# Patient Record
Sex: Female | Born: 1980 | Race: White | Hispanic: No | Marital: Married | Smoking: Never smoker
Health system: Southern US, Community
[De-identification: ages and names within clinical notes are randomized; demographics above are authoritative.]

## PROBLEM LIST (undated history)

## (undated) DIAGNOSIS — D649 Anemia, unspecified: Secondary | ICD-10-CM

## (undated) DIAGNOSIS — O4290 Premature rupture of membranes, unspecified as to length of time between rupture and onset of labor, unspecified weeks of gestation: Secondary | ICD-10-CM

## (undated) DIAGNOSIS — R51 Headache: Secondary | ICD-10-CM

## (undated) HISTORY — DX: Anemia, unspecified: D64.9

## (undated) HISTORY — PX: DILATION AND CURETTAGE OF UTERUS: SHX78

## (undated) HISTORY — DX: Headache: R51

---

## 2005-03-05 ENCOUNTER — Emergency Department (HOSPITAL_COMMUNITY): Admission: EM | Admit: 2005-03-05 | Discharge: 2005-03-05 | Payer: Self-pay | Admitting: Family Medicine

## 2005-11-18 ENCOUNTER — Other Ambulatory Visit: Admission: RE | Admit: 2005-11-18 | Discharge: 2005-11-18 | Payer: Self-pay | Admitting: Family Medicine

## 2005-12-30 HISTORY — PX: WISDOM TOOTH EXTRACTION: SHX21

## 2007-03-30 ENCOUNTER — Other Ambulatory Visit: Admission: RE | Admit: 2007-03-30 | Discharge: 2007-03-30 | Payer: Self-pay | Admitting: Family Medicine

## 2008-03-30 ENCOUNTER — Other Ambulatory Visit: Admission: RE | Admit: 2008-03-30 | Discharge: 2008-03-30 | Payer: Self-pay | Admitting: Family Medicine

## 2009-05-05 ENCOUNTER — Other Ambulatory Visit: Admission: RE | Admit: 2009-05-05 | Discharge: 2009-05-05 | Payer: Self-pay | Admitting: Family Medicine

## 2010-06-25 ENCOUNTER — Other Ambulatory Visit: Admission: RE | Admit: 2010-06-25 | Discharge: 2010-06-25 | Payer: Self-pay | Admitting: Family Medicine

## 2010-12-30 HISTORY — PX: DILATION AND CURETTAGE OF UTERUS: SHX78

## 2011-02-07 ENCOUNTER — Other Ambulatory Visit: Payer: Self-pay | Admitting: Obstetrics and Gynecology

## 2011-02-07 ENCOUNTER — Observation Stay (HOSPITAL_COMMUNITY)
Admission: AD | Admit: 2011-02-07 | Discharge: 2011-02-08 | Disposition: A | Discharge status: Home / Self Care | Payer: BC Managed Care – PPO | Source: Ambulatory Visit | Attending: Obstetrics and Gynecology | Admitting: Obstetrics and Gynecology

## 2011-02-07 ENCOUNTER — Inpatient Hospital Stay (HOSPITAL_COMMUNITY): Payer: BC Managed Care – PPO

## 2011-02-07 DIAGNOSIS — R58 Hemorrhage, not elsewhere classified: Secondary | ICD-10-CM

## 2011-02-07 DIAGNOSIS — O034 Incomplete spontaneous abortion without complication: Principal | ICD-10-CM | POA: Insufficient documentation

## 2011-02-07 DIAGNOSIS — D649 Anemia, unspecified: Secondary | ICD-10-CM | POA: Insufficient documentation

## 2011-02-07 DIAGNOSIS — R000 Tachycardia, unspecified: Secondary | ICD-10-CM | POA: Insufficient documentation

## 2011-02-07 LAB — SAMPLE TO BLOOD BANK

## 2011-02-07 LAB — CBC
HCT: 29 % — ABNORMAL LOW (ref 36.0–46.0)
MCH: 30.3 pg (ref 26.0–34.0)
MCHC: 33.7 g/dL (ref 30.0–36.0)
Platelets: 268 10*3/uL (ref 150–400)
Platelets: 220 10*3/uL (ref 150–400)
Platelets: 184 10*3/uL (ref 150–400)
RBC: 4.33 MIL/uL (ref 3.87–5.11)
RBC: 3.21 MIL/uL — ABNORMAL LOW (ref 3.87–5.11)
RBC: 2.85 MIL/uL — ABNORMAL LOW (ref 3.87–5.11)
RDW: 12.8 % (ref 11.5–15.5)
RDW: 12.8 % (ref 11.5–15.5)
WBC: 24.7 10*3/uL — ABNORMAL HIGH (ref 4.0–10.5)
WBC: 17.7 10*3/uL — ABNORMAL HIGH (ref 4.0–10.5)

## 2011-02-08 LAB — CBC
Hemoglobin: 7.2 g/dL — ABNORMAL LOW (ref 12.0–15.0)
RBC: 2.35 MIL/uL — ABNORMAL LOW (ref 3.87–5.11)

## 2011-02-11 NOTE — Op Note (Signed)
  [name], [name]                ACCOUNT NO.:  [id number]  MEDICAL RECORD NO.:  [id number]           PATIENT TYPE:  I  LOCATION:  9320                          FACILITY:  [facility]  PHYSICIAN:  Osborn Coho, M.D.   DATE OF BIRTH:  1981/10/04  DATE OF PROCEDURE:  02/07/2011 DATE OF DISCHARGE:                              OPERATIVE REPORT   PREOPERATIVE DIAGNOSES: 1. Incomplete abortion. 2. Anemia. 3. Tachycardia.  POSTOPERATIVE DIAGNOSES: 1. Incomplete abortion. 2. Anemia. 3. Tachycardia.  PROCEDURE:  Suction D and C.  ATTENDING SURGEON:  Osborn Coho, MD  ANESTHESIA:  MAC and local.  FLUIDS:  800 mL.  URINE OUTPUT:  Quantity sufficient via straight cath prior to procedure.  ESTIMATED BLOOD LOSS:  Minimal in the OR, but probably at least about 300-500 in the MAU.  SPECIMENS TO PATHOLOGY:  Products of conception.  COMPLICATIONS:  None.  FINDINGS:  Cynthia Henderson is a 30 year old gravida 1, para 0 who presented to the MAU with heavy bleeding and passing tissue.  I was called to evaluate the patient, and there was tissue in the cervical os, which was removed, and the patient was still having some bleeding and cramping. Ultrasound showed on preliminary report to still have retained products in the uterus.  The patient was tachycardic and was being administered IV fluids, and her blood pressure was stable.  Her hemoglobin dropped from about 13-9, and her white blood cell count was 27.  Gentamicin and clindamycin ordered for the OR, and the patient consented for a suction D and C after explaining to her the risks, benefits, and alternatives.  PROCEDURE IN DETAIL:  The patient was taken to the operating room after the risks, benefits, and alternatives were discussed with the patient. The patient verbalized understanding, consent signed and witnessed.  The patient was given a MAC per anesthesia and prepped and draped in the normal sterile fashion in the dorsal lithotomy  position.  A bivalve speculum was placed in the vagina, and the anterior lip of the cervix grasped with single-tooth tenaculum.  A paracervical block was administered using a total of 10 mL of 1% lidocaine, and the cervix noted to be already dilated and sounded to 8 cm.  A size 7 suction curette was used and suction curettage was performed.  Sharp curettage was then performed until a gritty texture was noted.  Suction curettage was performed once again to remove any remaining debris.  The instruments were removed, and there was good hemostasis at the tenaculum site.  Count was correct.  The patient tolerated the procedure well and is currently awaiting transfer to the recovery room in good condition.     Osborn Coho, M.D.     AR/MEDQ  D:  02/07/2011  T:  02/08/2011  Job:  [id number]  Electronically Signed by Osborn Coho M.D. on 02/11/2011 04:32:27 PM

## 2011-03-28 NOTE — Discharge Summary (Signed)
  NAMEBORA, BOST                ACCOUNT NO.:  [id number]  MEDICAL RECORD NO.:  [id number]           PATIENT TYPE:  I  LOCATION:  9320                          [facility]:  [facility]  PHYSICIAN:  Osborn Coho, M.D.   DATE OF BIRTH:  [birth year]  DATE OF ADMISSION:  02/07/2011 DATE OF DISCHARGE:  02/08/2011                              DISCHARGE SUMMARY   ADMISSION DIAGNOSES: 1. Incomplete spontaneous abortion 2. Hemorrhage. 3. Anemia.  DISCHARGE DIAGNOSES: 1. Incomplete spontaneous abortion 2. Hemorrhage. 3. Anemia.  HOSPITAL PROCEDURES:  On February 07, 2011, the patient had a suction D and C for an incomplete SAB performed by Osborn Coho.  Good hemostasis was achieved.  There were no complications.  HOSPITAL COURSE:  The patient was transported to maternity admissions by the EMS from [facility] for incomplete SAB and a hemorrhage in progress.  While in maternity admissions, she experienced a drop in hemoglobin from 13.1 down to 9.8 in 2 hours' time was also experiencing status significant tachycardia in the one teens at rest, decision was made to proceed with suction D and E.  The patient's bleeding stabilized, but she still has significant tachycardia up to one 70s.. She was admitted for observation, orthostatic vital signs, repeat CBC in the morning.  By the following morning, the patient was feeling much better.  She denied any dizziness.  She had orthostatic changes in her vital signs but declined blood transfusion.  Her hemoglobin had dropped to 7.2.    DISCHARGE CONDITION: Stable  DISCHARGE INSTRUCTIONS: Discharge instructions per D and E instruction sheet.  DISCHARGE FOLLOW-UP: One week at [facility]  DISCHARGE MEDICATIONS:  Floradix liquid iron supplement twice a day, Motrin, and Vicodin p.r.n. pain.   Labs were as follows.  On February 07, 2011, at 10:51 a.m., CBC showed white blood cell count of 16.6, hemoglobin of 13.1, hematocrit of 38.9, platelets  of 268.  At 12:57, CBC showed white blood cell count of 24.7, hemoglobin of 9.8, hematocrit of 29.0, platelets of 220.  Postop CBC that evening and 1820 showed white blood cell count of 17.7, hemoglobin of 8.7, hematocrit of 25.6, and platelets of 184.  On postop day #1, CBC on February 08, 2011, at 5:20 a.m. showed white blood cell count of 8.9, hemoglobin of 7.2, hematocrit of 21.1 and platelet count of 182.  Pathology report confirmed products of conception.     Dorathy Kinsman, CNM   ______________________________ Osborn Coho, M.D.    VS/MEDQ  D:  02/24/2011  T:  02/25/2011  Job:  [id number]  Electronically Signed by Dorathy Kinsman  on 02/25/2011 11:29:53 PM Electronically Signed by Osborn Coho M.D. on 03/28/2011 09:48:41 AM

## 2011-12-31 NOTE — L&D Delivery Note (Addendum)
Delivery Note At 5:32 PM a viable female was delivered via Vaginal, Spontaneous Delivery (Presentation: LOA, easy delivery of the head, easy delivery of the shoulders, loose nuchal cord x 1 slipped over head, baby placed on pt abd, cord doubly clamped and cut ).  APGAR: 9, 9; weight .   Placenta status: Intact, Spontaneous, schultze.  Cord: 3 vessels with the following complications: None.  Cord blood obtained.  Anesthesia: Epidural  Episiotomy: None Lacerations: 1st degree;Labial bilateral Suture Repair: 3-monocryl Est. Blood Loss (mL):   Mom to postpartum.  Baby to nursery-stable.  Liban Guedes 07/20/2012, 6:10 PM

## 2012-01-15 LAB — ABO/RH: RH Type: POSITIVE

## 2012-01-15 LAB — HIV ANTIBODY (ROUTINE TESTING W REFLEX): HIV: NONREACTIVE

## 2012-01-15 LAB — ANTIBODY SCREEN: Antibody Screen: NEGATIVE

## 2012-01-16 LAB — GC/CHLAMYDIA PROBE AMP, GENITAL
Chlamydia: NEGATIVE
Gonorrhea: NEGATIVE

## 2012-03-10 ENCOUNTER — Other Ambulatory Visit: Payer: Self-pay

## 2012-03-10 ENCOUNTER — Encounter: Payer: Self-pay | Admitting: Obstetrics and Gynecology

## 2012-03-11 ENCOUNTER — Other Ambulatory Visit: Payer: BC Managed Care – PPO

## 2012-03-11 ENCOUNTER — Encounter (INDEPENDENT_AMBULATORY_CARE_PROVIDER_SITE_OTHER): Payer: BC Managed Care – PPO | Admitting: Obstetrics and Gynecology

## 2012-03-11 DIAGNOSIS — Z331 Pregnant state, incidental: Secondary | ICD-10-CM

## 2012-03-11 DIAGNOSIS — Z1389 Encounter for screening for other disorder: Secondary | ICD-10-CM

## 2012-04-01 ENCOUNTER — Encounter (INDEPENDENT_AMBULATORY_CARE_PROVIDER_SITE_OTHER): Payer: BC Managed Care – PPO

## 2012-04-01 DIAGNOSIS — Z331 Pregnant state, incidental: Secondary | ICD-10-CM

## 2012-04-08 ENCOUNTER — Encounter: Payer: BC Managed Care – PPO | Admitting: Obstetrics and Gynecology

## 2012-04-15 ENCOUNTER — Encounter: Payer: Self-pay | Admitting: Obstetrics and Gynecology

## 2012-04-15 ENCOUNTER — Ambulatory Visit (INDEPENDENT_AMBULATORY_CARE_PROVIDER_SITE_OTHER): Payer: BC Managed Care – PPO | Admitting: Obstetrics and Gynecology

## 2012-04-15 VITALS — BP 100/52 | Wt 116.0 lb

## 2012-04-15 DIAGNOSIS — T360X5A Adverse effect of penicillins, initial encounter: Secondary | ICD-10-CM

## 2012-04-15 DIAGNOSIS — Z331 Pregnant state, incidental: Secondary | ICD-10-CM

## 2012-04-15 DIAGNOSIS — Z880 Allergy status to penicillin: Secondary | ICD-10-CM | POA: Insufficient documentation

## 2012-04-15 DIAGNOSIS — Z888 Allergy status to other drugs, medicaments and biological substances status: Secondary | ICD-10-CM

## 2012-04-15 NOTE — Progress Notes (Signed)
Parvovirus serology completed and c/w immunity  Just lost 31 yo father to unexpected cancer...grieving  Counseling offered Glucola at next glucola

## 2012-04-15 NOTE — Progress Notes (Signed)
Double check pregravid weight

## 2012-04-29 ENCOUNTER — Ambulatory Visit (INDEPENDENT_AMBULATORY_CARE_PROVIDER_SITE_OTHER): Payer: BC Managed Care – PPO | Admitting: Obstetrics and Gynecology

## 2012-04-29 ENCOUNTER — Other Ambulatory Visit: Payer: BC Managed Care – PPO

## 2012-04-29 VITALS — BP 118/70 | Wt 120.0 lb

## 2012-04-29 DIAGNOSIS — Z331 Pregnant state, incidental: Secondary | ICD-10-CM

## 2012-04-29 MED ORDER — HYDROCORTISONE ACE-PRAMOXINE 1-1 % RE CREA: TOPICAL_CREAM | Freq: Two times a day (BID) | RECTAL | Status: AC

## 2012-04-29 NOTE — Progress Notes (Signed)
Doing well. Glucola, hemoglobin, RPR today. Prenatal classes discussed.breast-feeding discussed. Proctocream HC for hemorrhoids. Return office in 3 weeks. Dr. Stefano Gaul

## 2012-04-29 NOTE — Progress Notes (Signed)
Pt c/o back pain  Lm       glucola given    Blood draw at 3:03pm

## 2012-04-30 LAB — HEMOGLOBIN: Hemoglobin: 12.2 g/dL (ref 12.0–15.0)

## 2012-05-20 ENCOUNTER — Ambulatory Visit (INDEPENDENT_AMBULATORY_CARE_PROVIDER_SITE_OTHER): Payer: BC Managed Care – PPO | Admitting: Obstetrics and Gynecology

## 2012-05-20 ENCOUNTER — Encounter: Payer: Self-pay | Admitting: Obstetrics and Gynecology

## 2012-05-20 VITALS — BP 112/60 | Wt 124.0 lb

## 2012-05-20 DIAGNOSIS — N898 Other specified noninflammatory disorders of vagina: Secondary | ICD-10-CM

## 2012-05-20 DIAGNOSIS — O26899 Other specified pregnancy related conditions, unspecified trimester: Secondary | ICD-10-CM

## 2012-05-20 DIAGNOSIS — Z331 Pregnant state, incidental: Secondary | ICD-10-CM

## 2012-05-20 DIAGNOSIS — N930 Postcoital and contact bleeding: Secondary | ICD-10-CM

## 2012-05-20 LAB — POCT WET PREP (WET MOUNT)

## 2012-05-20 LAB — FETAL FIBRONECTIN: Fetal Fibronectin: NEGATIVE

## 2012-05-20 NOTE — Progress Notes (Signed)
Glucola 97.  Hemoglobin 12.2.  RPR nonreactive. Patient complains of postcoital spotting.  Speculum exam shows a hyperemic cervix.  Cervix closed, 25% effaced, ballotable/-3. Wet prep: Negative FFN, GC, Chl sent Return to office in 2 weeks.

## 2012-05-20 NOTE — Progress Notes (Signed)
Pt c/o small amount of pink blood after intercourse.

## 2012-05-21 LAB — GC/CHLAMYDIA PROBE AMP, GENITAL
Chlamydia, DNA Probe: NEGATIVE
GC Probe Amp, Genital: NEGATIVE

## 2012-06-04 ENCOUNTER — Ambulatory Visit (INDEPENDENT_AMBULATORY_CARE_PROVIDER_SITE_OTHER): Payer: BC Managed Care – PPO | Admitting: Obstetrics and Gynecology

## 2012-06-04 ENCOUNTER — Encounter: Payer: Self-pay | Admitting: Obstetrics and Gynecology

## 2012-06-04 VITALS — BP 98/58 | Wt 126.0 lb

## 2012-06-04 DIAGNOSIS — Z3490 Encounter for supervision of normal pregnancy, unspecified, unspecified trimester: Secondary | ICD-10-CM

## 2012-06-04 DIAGNOSIS — O26879 Cervical shortening, unspecified trimester: Secondary | ICD-10-CM

## 2012-06-04 DIAGNOSIS — Z331 Pregnant state, incidental: Secondary | ICD-10-CM

## 2012-06-04 NOTE — Progress Notes (Signed)
No complaints.  Planning to drive to [facility] in 10 days and wants to know if its ok. Cx thinned but not dilated. Plan U/S for cx length next week and cx check to allow pt to make final decision. Pelvic rest until then.

## 2012-06-10 ENCOUNTER — Ambulatory Visit (INDEPENDENT_AMBULATORY_CARE_PROVIDER_SITE_OTHER): Payer: BC Managed Care – PPO

## 2012-06-10 ENCOUNTER — Telehealth: Payer: Self-pay | Admitting: Obstetrics and Gynecology

## 2012-06-10 ENCOUNTER — Ambulatory Visit (INDEPENDENT_AMBULATORY_CARE_PROVIDER_SITE_OTHER): Payer: BC Managed Care – PPO | Admitting: Obstetrics and Gynecology

## 2012-06-10 ENCOUNTER — Encounter: Payer: Self-pay | Admitting: Obstetrics and Gynecology

## 2012-06-10 VITALS — BP 120/70 | Wt 128.0 lb

## 2012-06-10 DIAGNOSIS — Z331 Pregnant state, incidental: Secondary | ICD-10-CM

## 2012-06-10 DIAGNOSIS — O26879 Cervical shortening, unspecified trimester: Secondary | ICD-10-CM

## 2012-06-10 LAB — FETAL FIBRONECTIN: Fetal Fibronectin: NEGATIVE

## 2012-06-10 NOTE — Progress Notes (Signed)
Cx today Wants to travel to [place] for 10 days

## 2012-06-10 NOTE — Progress Notes (Signed)
Traveling to [place] for family wedding--hopes to leave tomorrow. [place] for cervical length, growth today--cervix 2.64, with slight dynamic change to 2.5. Patient denies any significant contractions, occasional tightening. AFI 15.91, 55%ile, EFW 2366 gm, 5+3, 82.8%ile.  FFN negative on 5/24.  Repeated today.  Cervix closed, 50%, vtx, -2 by exam. Will allow travel if FFN done today is negative--sent STAT. Patient to call CNM on call if no result called to her by 7pm. Precautions reviewed. RTO 2 weeks.

## 2012-06-10 NOTE — Telephone Encounter (Signed)
TC to patient to f/u on office visit today, with FFN done-----results negative. OK for patient to travel to family wedding this week, with precautions reviewed. S/S of PTL reviewed with patient.

## 2012-06-24 ENCOUNTER — Ambulatory Visit (INDEPENDENT_AMBULATORY_CARE_PROVIDER_SITE_OTHER): Payer: BC Managed Care – PPO | Admitting: Obstetrics and Gynecology

## 2012-06-24 VITALS — BP 110/62 | Wt 132.0 lb

## 2012-06-24 DIAGNOSIS — Z331 Pregnant state, incidental: Secondary | ICD-10-CM

## 2012-06-24 NOTE — Progress Notes (Signed)
Doing well. Carpal tunnel. Return to office in 1 week. Dr. Stefano Gaul

## 2012-06-24 NOTE — Progress Notes (Signed)
C/O pain in her hands/swelling. No other concerns. No cervix check today.

## 2012-06-29 ENCOUNTER — Ambulatory Visit (INDEPENDENT_AMBULATORY_CARE_PROVIDER_SITE_OTHER): Payer: BC Managed Care – PPO | Admitting: Obstetrics and Gynecology

## 2012-06-29 ENCOUNTER — Other Ambulatory Visit: Payer: Self-pay | Admitting: Obstetrics and Gynecology

## 2012-06-29 ENCOUNTER — Encounter: Payer: Self-pay | Admitting: Obstetrics and Gynecology

## 2012-06-29 VITALS — BP 118/62 | Wt 133.0 lb

## 2012-06-29 DIAGNOSIS — O26879 Cervical shortening, unspecified trimester: Secondary | ICD-10-CM

## 2012-06-29 DIAGNOSIS — Z331 Pregnant state, incidental: Secondary | ICD-10-CM

## 2012-06-29 LAB — US OB FOLLOW UP

## 2012-06-29 NOTE — Progress Notes (Signed)
[redacted]w[redacted]d Doing well rv'd back stretches Plans no meds for labor GBS NV rv'd FKC and labor sx's

## 2012-06-29 NOTE — Progress Notes (Signed)
C/o back pain

## 2012-06-29 NOTE — Patient Instructions (Signed)

## 2012-07-06 ENCOUNTER — Ambulatory Visit (INDEPENDENT_AMBULATORY_CARE_PROVIDER_SITE_OTHER): Payer: BC Managed Care – PPO | Admitting: Obstetrics and Gynecology

## 2012-07-06 VITALS — BP 106/68 | Wt 137.0 lb

## 2012-07-06 DIAGNOSIS — Z331 Pregnant state, incidental: Secondary | ICD-10-CM

## 2012-07-06 NOTE — Progress Notes (Signed)
Good FA. No complaints. SOL reviewed.

## 2012-07-06 NOTE — Progress Notes (Signed)
Pt. Stated no issues today.  

## 2012-07-15 ENCOUNTER — Ambulatory Visit (INDEPENDENT_AMBULATORY_CARE_PROVIDER_SITE_OTHER): Payer: BC Managed Care – PPO | Admitting: Obstetrics and Gynecology

## 2012-07-15 ENCOUNTER — Encounter: Payer: Self-pay | Admitting: Obstetrics and Gynecology

## 2012-07-15 VITALS — BP 124/66 | Wt 139.0 lb

## 2012-07-15 DIAGNOSIS — Z3490 Encounter for supervision of normal pregnancy, unspecified, unspecified trimester: Secondary | ICD-10-CM

## 2012-07-15 DIAGNOSIS — Z331 Pregnant state, incidental: Secondary | ICD-10-CM

## 2012-07-15 NOTE — Progress Notes (Signed)
[redacted]w[redacted]d SVE: 2- 3 cms /80%/0 C/o irregular BH Ctxs - has been given labor information handout. Reassured ROB x1 week

## 2012-07-15 NOTE — Progress Notes (Signed)
No complaints. Request Cx check.

## 2012-07-17 ENCOUNTER — Telehealth: Payer: Self-pay | Admitting: Obstetrics and Gynecology

## 2012-07-17 NOTE — Telephone Encounter (Signed)
Reviewed s/s uc, srom vs vaginal discharge , vag bleeding, daily fetal kick counts to report, comfort measures. Encouragged 8 water daily and frequent voids. Lavera Guise, CNM

## 2012-07-19 ENCOUNTER — Inpatient Hospital Stay (HOSPITAL_COMMUNITY)
Admission: AD | Admit: 2012-07-19 | Discharge: 2012-07-22 | DRG: 372 | Disposition: A | Discharge status: Home / Self Care | Payer: BC Managed Care – PPO | Source: Ambulatory Visit | Attending: Obstetrics and Gynecology | Admitting: Obstetrics and Gynecology

## 2012-07-19 DIAGNOSIS — O4290 Premature rupture of membranes, unspecified as to length of time between rupture and onset of labor, unspecified weeks of gestation: Principal | ICD-10-CM | POA: Diagnosis present

## 2012-07-19 HISTORY — DX: Premature rupture of membranes, unspecified as to length of time between rupture and onset of labor, unspecified weeks of gestation: O42.90

## 2012-07-20 ENCOUNTER — Encounter (HOSPITAL_COMMUNITY): Payer: Self-pay | Admitting: *Deleted

## 2012-07-20 ENCOUNTER — Encounter (HOSPITAL_COMMUNITY): Payer: Self-pay | Admitting: Anesthesiology

## 2012-07-20 ENCOUNTER — Inpatient Hospital Stay (HOSPITAL_COMMUNITY): Payer: BC Managed Care – PPO | Admitting: Anesthesiology

## 2012-07-20 DIAGNOSIS — O4290 Premature rupture of membranes, unspecified as to length of time between rupture and onset of labor, unspecified weeks of gestation: Secondary | ICD-10-CM

## 2012-07-20 HISTORY — DX: Premature rupture of membranes, unspecified as to length of time between rupture and onset of labor, unspecified weeks of gestation: O42.90

## 2012-07-20 LAB — TYPE AND SCREEN: Antibody Screen: NEGATIVE

## 2012-07-20 LAB — OB RESULTS CONSOLE GBS: GBS: NEGATIVE

## 2012-07-20 LAB — OB RESULTS CONSOLE HIV ANTIBODY (ROUTINE TESTING): HIV: NONREACTIVE

## 2012-07-20 LAB — OB RESULTS CONSOLE GC/CHLAMYDIA
Chlamydia: NEGATIVE
Gonorrhea: NEGATIVE

## 2012-07-20 LAB — CBC
HCT: 36.5 % (ref 36.0–46.0)
Hemoglobin: 12.5 g/dL (ref 12.0–15.0)
MCV: 90.6 fL (ref 78.0–100.0)
RBC: 4.03 MIL/uL (ref 3.87–5.11)
WBC: 11 10*3/uL — ABNORMAL HIGH (ref 4.0–10.5)

## 2012-07-20 LAB — OB RESULTS CONSOLE RUBELLA ANTIBODY, IGM: Rubella: IMMUNE

## 2012-07-20 LAB — ABO/RH: ABO/RH(D): O POS

## 2012-07-20 MED ORDER — ONDANSETRON HCL 4 MG/2ML IJ SOLN: 4.0000 mg | INTRAMUSCULAR | Status: DC | PRN

## 2012-07-20 MED ORDER — DIPHENHYDRAMINE HCL 25 MG PO CAPS: 25.0000 mg | ORAL_CAPSULE | Freq: Four times a day (QID) | ORAL | Status: DC | PRN

## 2012-07-20 MED ORDER — TETANUS-DIPHTH-ACELL PERTUSSIS 5-2.5-18.5 LF-MCG/0.5 IM SUSP: 0.5000 mL | Freq: Once | INTRAMUSCULAR | Status: DC

## 2012-07-20 MED ORDER — ZOLPIDEM TARTRATE 5 MG PO TABS: 5.0000 mg | ORAL_TABLET | Freq: Every evening | ORAL | Status: DC | PRN

## 2012-07-20 MED ORDER — LANOLIN HYDROUS EX OINT: TOPICAL_OINTMENT | CUTANEOUS | Status: DC | PRN

## 2012-07-20 MED ORDER — EPHEDRINE 5 MG/ML INJ
10.0000 mg | INTRAVENOUS | Status: DC | PRN
  Filled 2012-07-20: qty 4

## 2012-07-20 MED ORDER — DIBUCAINE 1 % RE OINT
1.0000 "application " | TOPICAL_OINTMENT | RECTAL | Status: DC | PRN
  Administered 2012-07-20: 1 via RECTAL
  Filled 2012-07-20: qty 28

## 2012-07-20 MED ORDER — ONDANSETRON HCL 4 MG/2ML IJ SOLN: 4.0000 mg | Freq: Four times a day (QID) | INTRAMUSCULAR | Status: DC | PRN

## 2012-07-20 MED ORDER — BUTORPHANOL TARTRATE 2 MG/ML IJ SOLN
2.0000 mg | INTRAMUSCULAR | Status: DC | PRN
  Administered 2012-07-20: 2 mg via INTRAVENOUS

## 2012-07-20 MED ORDER — DIPHENHYDRAMINE HCL 50 MG/ML IJ SOLN: 12.5000 mg | INTRAMUSCULAR | Status: DC | PRN

## 2012-07-20 MED ORDER — SENNOSIDES-DOCUSATE SODIUM 8.6-50 MG PO TABS
2.0000 | ORAL_TABLET | Freq: Every day | ORAL | Status: DC
  Administered 2012-07-20 – 2012-07-21 (×2): 2 via ORAL

## 2012-07-20 MED ORDER — BUTORPHANOL TARTRATE 2 MG/ML IJ SOLN
INTRAMUSCULAR | Status: AC
  Filled 2012-07-20: qty 1

## 2012-07-20 MED ORDER — PHENYLEPHRINE 40 MCG/ML (10ML) SYRINGE FOR IV PUSH (FOR BLOOD PRESSURE SUPPORT): 80.0000 ug | PREFILLED_SYRINGE | INTRAVENOUS | Status: DC | PRN

## 2012-07-20 MED ORDER — LACTATED RINGERS IV SOLN: 500.0000 mL | INTRAVENOUS | Status: DC | PRN

## 2012-07-20 MED ORDER — LACTATED RINGERS IV SOLN
INTRAVENOUS | Status: DC
  Administered 2012-07-20: 125 mL/h via INTRAVENOUS

## 2012-07-20 MED ORDER — FENTANYL 2.5 MCG/ML BUPIVACAINE 1/10 % EPIDURAL INFUSION (WH - ANES)
14.0000 mL/h | INTRAMUSCULAR | Status: DC
  Administered 2012-07-20: 14 mL/h via EPIDURAL
  Filled 2012-07-20: qty 60

## 2012-07-20 MED ORDER — BENZOCAINE-MENTHOL 20-0.5 % EX AERO
1.0000 "application " | INHALATION_SPRAY | CUTANEOUS | Status: DC | PRN
  Administered 2012-07-20: 1 via TOPICAL
  Filled 2012-07-20 (×2): qty 56

## 2012-07-20 MED ORDER — EPHEDRINE 5 MG/ML INJ: 10.0000 mg | INTRAVENOUS | Status: DC | PRN

## 2012-07-20 MED ORDER — TERBUTALINE SULFATE 1 MG/ML IJ SOLN: 0.2500 mg | Freq: Once | INTRAMUSCULAR | Status: DC | PRN

## 2012-07-20 MED ORDER — PROMETHAZINE HCL 25 MG/ML IJ SOLN
12.5000 mg | INTRAMUSCULAR | Status: DC | PRN
Start: 2012-07-20 — End: 2012-07-20

## 2012-07-20 MED ORDER — OXYCODONE-ACETAMINOPHEN 5-325 MG PO TABS
1.0000 | ORAL_TABLET | ORAL | Status: DC | PRN
  Administered 2012-07-21: 1 via ORAL
  Filled 2012-07-20 (×2): qty 1

## 2012-07-20 MED ORDER — WITCH HAZEL-GLYCERIN EX PADS
1.0000 "application " | MEDICATED_PAD | CUTANEOUS | Status: DC | PRN
  Administered 2012-07-20: 1 via TOPICAL

## 2012-07-20 MED ORDER — SIMETHICONE 80 MG PO CHEW: 80.0000 mg | CHEWABLE_TABLET | ORAL | Status: DC | PRN

## 2012-07-20 MED ORDER — IBUPROFEN 600 MG PO TABS
600.0000 mg | ORAL_TABLET | Freq: Four times a day (QID) | ORAL | Status: DC
  Administered 2012-07-21 – 2012-07-22 (×6): 600 mg via ORAL
  Filled 2012-07-20 (×6): qty 1

## 2012-07-20 MED ORDER — OXYTOCIN 40 UNITS IN LACTATED RINGERS INFUSION - SIMPLE MED
62.5000 mL/h | Freq: Once | INTRAVENOUS | Status: AC
  Administered 2012-07-20: 62.5 mL/h via INTRAVENOUS

## 2012-07-20 MED ORDER — LIDOCAINE HCL (PF) 1 % IJ SOLN
INTRAMUSCULAR | Status: DC | PRN
  Administered 2012-07-20 (×3): 4 mL

## 2012-07-20 MED ORDER — LIDOCAINE HCL (PF) 1 % IJ SOLN
30.0000 mL | INTRAMUSCULAR | Status: DC | PRN
  Filled 2012-07-20: qty 30

## 2012-07-20 MED ORDER — CITRIC ACID-SODIUM CITRATE 334-500 MG/5ML PO SOLN: 30.0000 mL | ORAL | Status: DC | PRN

## 2012-07-20 MED ORDER — LACTATED RINGERS IV SOLN
500.0000 mL | Freq: Once | INTRAVENOUS | Status: AC
  Administered 2012-07-20: 500 mL via INTRAVENOUS

## 2012-07-20 MED ORDER — ONDANSETRON HCL 4 MG PO TABS: 4.0000 mg | ORAL_TABLET | ORAL | Status: DC | PRN

## 2012-07-20 MED ORDER — FLEET ENEMA 7-19 GM/118ML RE ENEM: 1.0000 | ENEMA | RECTAL | Status: DC | PRN

## 2012-07-20 MED ORDER — OXYCODONE-ACETAMINOPHEN 5-325 MG PO TABS: 1.0000 | ORAL_TABLET | ORAL | Status: DC | PRN

## 2012-07-20 MED ORDER — OXYTOCIN 40 UNITS IN LACTATED RINGERS INFUSION - SIMPLE MED
1.0000 m[IU]/min | INTRAVENOUS | Status: DC
  Administered 2012-07-20: 1 m[IU]/min via INTRAVENOUS

## 2012-07-20 MED ORDER — PRENATAL MULTIVITAMIN CH
1.0000 | ORAL_TABLET | Freq: Every day | ORAL | Status: DC
  Administered 2012-07-21: 1 via ORAL
  Filled 2012-07-20 (×2): qty 1

## 2012-07-20 MED ORDER — ACETAMINOPHEN 325 MG PO TABS: 650.0000 mg | ORAL_TABLET | ORAL | Status: DC | PRN

## 2012-07-20 MED ORDER — OXYTOCIN BOLUS FROM INFUSION
250.0000 mL | Freq: Once | INTRAVENOUS | Status: DC
  Filled 2012-07-20: qty 500

## 2012-07-20 MED ORDER — PHENYLEPHRINE 40 MCG/ML (10ML) SYRINGE FOR IV PUSH (FOR BLOOD PRESSURE SUPPORT)
80.0000 ug | PREFILLED_SYRINGE | INTRAVENOUS | Status: DC | PRN
  Filled 2012-07-20: qty 5

## 2012-07-20 MED ORDER — IBUPROFEN 600 MG PO TABS: 600.0000 mg | ORAL_TABLET | Freq: Four times a day (QID) | ORAL | Status: DC | PRN

## 2012-07-20 NOTE — Progress Notes (Signed)
Comfortable, some pressure O VSS      fhts category 1      abd soft between uc      Contractions q 2 mod      Vag C 9 BP 131/73  Pulse 75  Temp 98 F (36.7 C) (Oral)  Resp 20  Ht 5\' 4"  (1.626 m)  Wt 140 lb (63.504 kg)  BMI 24.03 kg/m2  SpO2 100% A active labor P labor down, continue care Lavera Guise, CNM

## 2012-07-20 NOTE — Progress Notes (Addendum)
Comfortable, some pressure O VSS  height is 5\' 4"  (1.626 m) and weight is 140 lb (63.504 kg). Her oral temperature is 98.1 F (36.7 C). Her blood pressure is 128/73 and her pulse is 92. Her respiration is 20.       fhts category 1      abd soft between uc q 2 mild to mod      Contractions q 2 mod      Vag 3 90 -1 arm forebag large amount clear Results for orders placed during the hospital encounter of 07/19/12 (from the past 24 hour(s))  OB RESULTS CONSOLE GBS     Status: Normal      Component Value Range   GBS Negative    OB RESULTS CONSOLE GC/CHLAMYDIA     Status: Normal      Component Value Range   Gonorrhea Negative     Chlamydia Negative    OB RESULTS CONSOLE RPR     Status: Normal      Component Value Range   RPR Nonreactive    OB RESULTS CONSOLE HIV ANTIBODY (ROUTINE TESTING)     Status: Normal      Component Value Range   HIV Non-reactive    OB RESULTS CONSOLE RUBELLA ANTIBODY, IGM     Status: Normal      Component Value Range   Rubella Immune    OB RESULTS CONSOLE HEPATITIS B SURFACE ANTIGEN     Status: Normal      Component Value Range   Hepatitis B Surface Ag Negative    OB RESULTS CONSOLE ABO/RH     Status: Normal      Component Value Range   RH Type  Positive     ABO Grouping O    OB RESULTS CONSOLE ANTIBODY SCREEN     Status: Normal      Component Value Range   Antibody Screen Negative    AMNISURE RUPTURE OF MEMBRANE (ROM)     Status: Normal   Collection Time   07/19/12 10:40 PM      Component Value Range   Amnisure ROM POSITIVE    CBC     Status: Abnormal   Collection Time   07/20/12  3:30 AM      Component Value Range   WBC 11.0 (*) 4.0 - 10.5 K/uL   RBC 4.03  3.87 - 5.11 MIL/uL   Hemoglobin 12.5  12.0 - 15.0 g/dL   HCT 16.1  09.6 - 04.5 %   MCV 90.6  78.0 - 100.0 fL   MCH 31.0  26.0 - 34.0 pg   MCHC 34.2  30.0 - 36.0 g/dL   RDW 40.9  81.1 - 91.4 %   Platelets 214  150 - 400 K/uL   A ea continue care Lavera Guise, CNM

## 2012-07-20 NOTE — Progress Notes (Signed)
Cynthia Henderson is a 31 y.o. G2P0010 at [redacted]w[redacted]d by ultrasound admitted for PROM  Subjective: Pitocin just turned up to 47mu/min.  Pt reports feeling uncomfortable w/ some.  No VB.  GFM.   Husband "Cynthia Henderson" at bedside.  Objective: BP 130/70  Pulse 84  Temp 98.4 F (36.9 C) (Oral)  Resp 18  Ht 5\' 4"  (1.626 m)  Wt 140 lb (63.504 kg)  BMI 24.03 kg/m2    .Marland Kitchen Filed Vitals:   07/20/12 0301 07/20/12 0320 07/20/12 0331  BP: 124/78  130/70  Pulse: 88  84  Temp: 98.4 F (36.9 C)    TempSrc: Oral    Resp: 18  18  Height:  5\' 4"  (1.626 m)   Weight:  140 lb (63.504 kg)     FHT:  FHR: 130 bpm, variability: moderate,  accelerations:  Present,  decelerations:  Absent UC:   regular, every 2-3 minutes SVE:   Dilation: 4 Effacement (%): 50 Station: -2 Exam by:: Penley, RN   Labs: No results found for this basename: WBC, HGB, HCT, MCV, PLT    Assessment / Plan: 1. [redacted]w[redacted]d 2. Suspected Prolonged ROM  3. favorable cx  4. GBS neg  Labor: IOL for PROM Preeclampsia:  no signs or symptoms of toxicity Fetal Wellbeing:  Category I Pain Control:  Labor support without medications I/D:  n/a Anticipated MOD:  NSVD 1.  Continue to titrate Pitocin; IUPC prn. 2.  C/w MD prn 3.  Labor support as needed; pt undecided on pain intervention in labor Cynthia Henderson 07/20/2012, 3:52 AM

## 2012-07-20 NOTE — Anesthesia Preprocedure Evaluation (Signed)

## 2012-07-20 NOTE — Anesthesia Procedure Notes (Signed)
Epidural Patient location during procedure: OB Start time: 07/20/2012 1:58 PM Reason for block: procedure for pain  Staffing Performed by: anesthesiologist   Preanesthetic Checklist Completed: patient identified, site marked, surgical consent, pre-op evaluation, timeout performed, IV checked, risks and benefits discussed and monitors and equipment checked  Epidural Patient position: sitting Prep: site prepped and draped and DuraPrep Patient monitoring: continuous pulse ox and blood pressure Approach: midline Injection technique: LOR air  Needle:  Needle type: Tuohy  Needle gauge: 17 G Needle length: 9 cm Needle insertion depth: 4 cm Catheter type: closed end flexible Catheter size: 19 Gauge Catheter at skin depth: 9 cm Test dose: negative  Assessment Events: blood not aspirated, injection not painful, no injection resistance, negative IV test and paresthesia (left leg paresthesia with initial placement.  Removed and replaced at same level with transient left leg paresthesia on replacement.  )  Additional Notes Discussed risk of headache, infection, bleeding, nerve injury and failed or incomplete block.  Patient voices understanding and wishes to proceed.

## 2012-07-20 NOTE — Progress Notes (Signed)
Calm, quiet with uc, states feeling uc more on R side O VSS      fhts category 1      abd soft between uc      Contractions q 2-3 mild to mod      Vag not assessed, on pitocin A PPROM P continue care Lavera Guise, CNM

## 2012-07-21 ENCOUNTER — Encounter (HOSPITAL_COMMUNITY): Payer: Self-pay | Admitting: *Deleted

## 2012-07-21 LAB — CBC
HCT: 32.1 % — ABNORMAL LOW (ref 36.0–46.0)
Hemoglobin: 11.1 g/dL — ABNORMAL LOW (ref 12.0–15.0)
MCH: 31.5 pg (ref 26.0–34.0)
RBC: 3.52 MIL/uL — ABNORMAL LOW (ref 3.87–5.11)

## 2012-07-21 NOTE — Anesthesia Postprocedure Evaluation (Signed)
  Anesthesia Post-op Note  Patient: Cynthia Henderson  Procedure(s) Performed: * No procedures listed *  Patient Location: Mother/Baby  Anesthesia Type: Epidural  Level of Consciousness: awake, alert  and oriented  Airway and Oxygen Therapy: Patient Spontanous Breathing  Post-op Pain: none  Post-op Assessment: Post-op Vital signs reviewed and Patient's Cardiovascular Status Stable  Post-op Vital Signs: Reviewed and stable  Complications: No apparent anesthesia complications

## 2012-07-21 NOTE — Progress Notes (Signed)
Post Partum Day 1 Subjective: no complaints, up ad lib, voiding, tolerating PO, + flatus and breastfeeding on my arrival to room.  VB like a period.  Husband remains supportive at bedside.  No fever or chills to report. Desire IP circ.  Objective: Blood pressure 105/70, pulse 92, temperature 98 F (36.7 C), temperature source Oral, resp. rate 16, height 5\' 4"  (1.626 m), weight 140 lb (63.504 kg), SpO2 98.00%, unknown if currently breastfeeding.  Physical Exam:  General: alert, cooperative and no distress Lochia: appropriate, rubra Uterine Fundus: firm, below umbilicus Incision: n/a DVT Evaluation: No evidence of DVT seen on physical exam. Negative Homan's sign. No edema.   Basename 07/21/12 0510 07/20/12 0330  HGB 11.1* 12.5  HCT 32.1* 36.5    Assessment/Plan: Plan for discharge tomorrow, Breastfeeding, Lactation consult and Circumcision prior to discharge Support as needed.  Continue current POC.  LOS: 2 days   Tereso Unangst H 07/21/2012, 9:14 AM

## 2012-07-22 ENCOUNTER — Encounter: Payer: Self-pay | Admitting: Obstetrics and Gynecology

## 2012-07-22 MED ORDER — NORETHINDRONE 0.35 MG PO TABS: 1.0000 | ORAL_TABLET | Freq: Every day | ORAL | Status: DC

## 2012-07-22 MED ORDER — IBUPROFEN 600 MG PO TABS: 600.0000 mg | ORAL_TABLET | Freq: Four times a day (QID) | ORAL | Status: AC | PRN

## 2012-07-22 NOTE — Discharge Summary (Signed)
Physician Discharge Summary  Patient ID: Cynthia Henderson MRN: [id number] DOB/AGE: May 04, 1981 31 y.o.  Admit date: 07/19/2012 Discharge date: 07/22/2012  Admission Diagnoses: 38 3/7 week IUP PRROM with uc  Discharge Diagnoses:  Principal Problem:  *Vaginal delivery normal involution, lactating  Discharged Condition: stable  Hospital Course: SVD after pitocin, epidural, bilateral labia lac, normal involution, lactating  Consults: None  Significant Diagnostic Studies: labs:  Hemoglobin & Hematocrit     Component Value Date/Time   HGB 11.1* 07/21/2012 0510   HCT 32.1* 07/21/2012 0510     Treatments: IV hydration  Discharge Exam: Blood pressure 117/70, pulse 82, temperature 97.9 F (36.6 C), temperature source Oral, resp. rate 18, height 5\' 4"  (1.626 m), weight 140 lb (63.504 kg), SpO2 98.00%, unknown if currently breastfeeding. General appearance: alert, cooperative and no distress S: comfortable, little bleeding, slept little     breastfeeding O VSS     abd soft, nt, ff      sm  Flow perineum bilateral labia lac well approximated, clean intact     -Homans sign bilaterally,          edema Disposition: Final discharge disposition not confirmed  Discharge Orders    Future Orders Please Complete By Expires   OB RESULT CONSOLE Group B Strep      Comments:   This external order was created through the Results Console.   OB RESULTS CONSOLE GC/Chlamydia      Comments:   This external order was created through the Results Console.   OB RESULTS CONSOLE RPR      Comments:   This external order was created through the Results Console.   OB RESULTS CONSOLE HIV antibody      Comments:   This external order was created through the Results Console.   OB RESULTS CONSOLE Rubella Antibody      Comments:   This external order was created through the Results Console.   OB RESULTS CONSOLE Hepatitis B surface antigen      Comments:   This external order was created through the Results  Console.   OB RESULTS CONSOLE ABO/Rh      Comments:   This external order was created through the Results Console.   OB RESULTS CONSOLE Antibody Screen      Comments:   This external order was created through the Results Console.     Medication List  As of 07/22/2012  8:07 AM   STOP taking these medications         calcium carbonate 500 MG chewable tablet      docusate sodium 100 MG capsule         TAKE these medications         ibuprofen 600 MG tablet   Commonly known as: ADVIL,MOTRIN   Take 1 tablet (600 mg total) by mouth every 6 (six) hours as needed for pain.      norethindrone 0.35 MG tablet   Commonly known as: MICRONOR,CAMILA,ERRIN   Take 1 tablet (0.35 mg total) by mouth daily.      prenatal multivitamin Tabs   Take 1 tablet by mouth at bedtime.           Follow-up Information    Follow up with [facility] in 6 weeks.       [facility] handbook  SignedLavera Guise 07/22/2012, 8:07 AM

## 2012-07-24 ENCOUNTER — Encounter: Payer: BC Managed Care – PPO | Admitting: Obstetrics and Gynecology

## 2012-07-28 ENCOUNTER — Telehealth: Payer: Self-pay | Admitting: Obstetrics and Gynecology

## 2012-07-28 ENCOUNTER — Ambulatory Visit (INDEPENDENT_AMBULATORY_CARE_PROVIDER_SITE_OTHER): Payer: BC Managed Care – PPO | Admitting: Obstetrics and Gynecology

## 2012-07-28 ENCOUNTER — Encounter: Payer: Self-pay | Admitting: Obstetrics and Gynecology

## 2012-07-28 VITALS — BP 108/70 | Temp 98.4°F | Ht 64.0 in | Wt 120.0 lb

## 2012-07-28 DIAGNOSIS — IMO0002 Reserved for concepts with insufficient information to code with codable children: Secondary | ICD-10-CM

## 2012-07-28 DIAGNOSIS — O99345 Other mental disorders complicating the puerperium: Secondary | ICD-10-CM

## 2012-07-28 NOTE — Progress Notes (Signed)
HISTORY OF PRESENT ILLNESS  [name] is a 31 y.o. year old female,G2P0010, who presents for a problem visit. The patient had a vaginal delivery on 07/20/12.  She is trying to breast-feed her infant. The husband is present for today's visit.  Subjective:  The patient reports that she feels overwhelmed trying to care for her baby.  Her baby wants to breast-feed all the time. She is not getting very much sleep.  She denies suicidal or homicidal thoughts.  Objective:  BP 108/70  Temp 98.4 F (36.9 C) (Oral)  Ht 5\' 4"  (1.626 m)  Wt 120 lb (54.432 kg)  BMI 20.60 kg/m2  LMP 10/25/2011  Breastfeeding? Yes   General: alert and mild distress Resp: clear to auscultation bilaterally Cardio: regular rate and rhythm, S1, S2 normal, no murmur, click, rub or gallop  Exam deferred.  Assessment:  Postpartum stress. Postpartum blues.  Plan:  Postpartum depression discussed.  Strategies for caring for the baby were outlined.  The patient was told to call for any concerns.  Return to office prn if symptoms worsen or fail to improve.  Return to the office in 1 week.  Nutrition discussed.  Leonard Schwartz M.D.  07/28/2012 6:35 PM

## 2012-07-28 NOTE — Telephone Encounter (Signed)
Pt c/o dehydration and is concerned about her milk supply. Scheduled appointment with Dr Stefano Gaul for this afternoon. Pt voiced understanding.

## 2012-07-28 NOTE — Telephone Encounter (Signed)
Triage/epic 

## 2012-08-04 ENCOUNTER — Encounter: Payer: Self-pay | Admitting: Obstetrics and Gynecology

## 2012-08-04 ENCOUNTER — Ambulatory Visit (INDEPENDENT_AMBULATORY_CARE_PROVIDER_SITE_OTHER): Payer: BC Managed Care – PPO | Admitting: Obstetrics and Gynecology

## 2012-08-04 VITALS — BP 104/78 | Wt 115.0 lb

## 2012-08-04 DIAGNOSIS — O99345 Other mental disorders complicating the puerperium: Secondary | ICD-10-CM

## 2012-08-04 DIAGNOSIS — IMO0002 Reserved for concepts with insufficient information to code with codable children: Secondary | ICD-10-CM

## 2012-08-04 DIAGNOSIS — K649 Unspecified hemorrhoids: Secondary | ICD-10-CM

## 2012-08-04 MED ORDER — HYDROCORTISONE ACE-PRAMOXINE 1-1 % RE CREA
TOPICAL_CREAM | Freq: Two times a day (BID) | RECTAL | Status: AC
Start: 2012-08-04 — End: 2012-08-14

## 2012-08-04 NOTE — Progress Notes (Addendum)
HISTORY OF PRESENT ILLNESS  [name] is a 31 y.o. year old female,G2P0010, who presents for a postpartum visit. She delivered on July 20, 2012.  She was seen 1 week ago because of postpartum blues  Subjective:  The patient reports that she was doing much better today.  She is getting more sleep and breast-feeding is going well.  She has no suicidal or homicidal thoughts. She complains of hemorrhoids.  Objective:  BP 104/78  Wt 115 lb (52.164 kg)  LMP 10/25/2011  Breastfeeding? Yes   General: alert, cooperative and no distress  Exam deferred.  Assessment:  Improved postpartum blues  hemorrhoids  Plan:  Continue current activities.  Proctocream HC  Return to office in 4 week(s).   Leonard Schwartz M.D.  08/04/2012 5:59 PM

## 2012-08-04 NOTE — Addendum Note (Signed)
Addended by: Janine Limbo on: 08/04/2012 06:06 PM   Modules accepted: Orders

## 2012-08-26 ENCOUNTER — Encounter: Payer: Self-pay | Admitting: Obstetrics and Gynecology

## 2012-08-26 ENCOUNTER — Ambulatory Visit (INDEPENDENT_AMBULATORY_CARE_PROVIDER_SITE_OTHER): Payer: BC Managed Care – PPO | Admitting: Obstetrics and Gynecology

## 2012-08-26 NOTE — Progress Notes (Signed)
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  Normal urinary function:  yes Normal GI function:  yes Returning to work:  Yes 09/29/2012 Subjective:     Cynthia Henderson is a 31 y.o. female who presents for a postpartum visit.  I have fully reviewed the prenatal and intrapartum course.    Patient is not sexually active.   The following portions of the patient's history were reviewed and updated as appropriate: allergies, current medications, past family history, past medical history, past social history, past surgical history and problem list.  Review of Systems Pertinent items are noted in HPI.   Objective:    BP 104/64  Wt 112 lb (50.803 kg)  Breastfeeding? Yes  General:  alert, cooperative and no distress     Lungs: clear to auscultation bilaterally  Heart:  regular rate and rhythm, S1, S2 normal, no murmur  Abdomen: soft, non-tender; bowel sounds normal; no masses,  no organomegaly   Vulva:  normal  Vagina: normal vagina  Cervix:  normal  Corpus: normal size, contour, position, consistency, mobility, non-tender  Adnexa:  normal adnexa             Assessment:     Normal postpartum exam.  Pap smear not done at today's visit.  RTO Jan 2014 for annual exam.  Plan:    Lanna Poche MD 08/26/2012 10:14 AM

## 2012-09-11 ENCOUNTER — Telehealth: Payer: Self-pay | Admitting: Obstetrics and Gynecology

## 2012-09-11 NOTE — Telephone Encounter (Signed)
TRIAGE/GENERAL QUEST. °

## 2012-09-11 NOTE — Telephone Encounter (Signed)
TC to pt. States has had clogged milk ducts previously but has increased this week Has just started MIcronor. Questioning if could be related. Informed since is a progesterone only pill, should be affect milk production or milk ducts. (Correct per DD.) Suggested heat,massage (ehich she has done.) States seems to be in one particular are in rt breast that does not seem to completely empty. Is only pumping. Denies any sx of mastitis. Suggested cabbage leaves. Also given number for lactation consultant at [facility]. Pt states will call for further suggestions.

## 2014-04-15 ENCOUNTER — Inpatient Hospital Stay (HOSPITAL_COMMUNITY): Payer: BC Managed Care – PPO | Admitting: Certified Registered"

## 2014-04-15 ENCOUNTER — Inpatient Hospital Stay (HOSPITAL_COMMUNITY)
Admission: AD | Admit: 2014-04-15 | Discharge: 2014-04-15 | Disposition: A | Discharge status: Home / Self Care | Payer: BC Managed Care – PPO | Source: Ambulatory Visit | Attending: Obstetrics and Gynecology | Admitting: Obstetrics and Gynecology

## 2014-04-15 ENCOUNTER — Encounter (HOSPITAL_COMMUNITY): Payer: Self-pay

## 2014-04-15 ENCOUNTER — Ambulatory Visit (HOSPITAL_COMMUNITY)
Admission: AD | Admit: 2014-04-15 | Payer: BC Managed Care – PPO | Source: Ambulatory Visit | Admitting: Obstetrics and Gynecology

## 2014-04-15 ENCOUNTER — Encounter (HOSPITAL_COMMUNITY): Payer: BC Managed Care – PPO | Admitting: Certified Registered"

## 2014-04-15 ENCOUNTER — Encounter (HOSPITAL_COMMUNITY)
Admission: AD | Disposition: A | Discharge status: Home / Self Care | Payer: Self-pay | Source: Ambulatory Visit | Attending: Obstetrics and Gynecology

## 2014-04-15 DIAGNOSIS — O021 Missed abortion: Secondary | ICD-10-CM | POA: Insufficient documentation

## 2014-04-15 HISTORY — PX: DILATION AND EVACUATION: SHX1459

## 2014-04-15 LAB — TYPE AND SCREEN
ABO/RH(D): O POS
ANTIBODY SCREEN: NEGATIVE

## 2014-04-15 LAB — CBC
HEMATOCRIT: 40.5 % (ref 36.0–46.0)
HEMOGLOBIN: 14 g/dL (ref 12.0–15.0)
MCH: 31.1 pg (ref 26.0–34.0)
MCHC: 34.6 g/dL (ref 30.0–36.0)
MCV: 90 fL (ref 78.0–100.0)
Platelets: 252 10*3/uL (ref 150–400)
RBC: 4.5 MIL/uL (ref 3.87–5.11)
RDW: 12.7 % (ref 11.5–15.5)
WBC: 12 10*3/uL — ABNORMAL HIGH (ref 4.0–10.5)

## 2014-04-15 LAB — ABO/RH: ABO/RH(D): O POS

## 2014-04-15 SURGERY — DILATION AND EVACUATION, UTERUS
Anesthesia: Monitor Anesthesia Care | Site: Vagina

## 2014-04-15 MED ORDER — LACTATED RINGERS IV SOLN
INTRAVENOUS | Status: DC
  Administered 2014-04-15: 15:00:00 via INTRAVENOUS

## 2014-04-15 MED ORDER — ONDANSETRON HCL 4 MG/2ML IJ SOLN
INTRAMUSCULAR | Status: DC | PRN
  Administered 2014-04-15: 4 mg via INTRAVENOUS

## 2014-04-15 MED ORDER — KETOROLAC TROMETHAMINE 30 MG/ML IJ SOLN
INTRAMUSCULAR | Status: DC | PRN
  Administered 2014-04-15: 30 mg via INTRAVENOUS

## 2014-04-15 MED ORDER — FENTANYL CITRATE 0.05 MG/ML IJ SOLN
INTRAMUSCULAR | Status: AC
  Filled 2014-04-15: qty 5

## 2014-04-15 MED ORDER — FENTANYL CITRATE 0.05 MG/ML IJ SOLN: 25.0000 ug | INTRAMUSCULAR | Status: DC | PRN

## 2014-04-15 MED ORDER — LIDOCAINE HCL (CARDIAC) 20 MG/ML IV SOLN
INTRAVENOUS | Status: DC | PRN
  Administered 2014-04-15: 40 mg via INTRAVENOUS

## 2014-04-15 MED ORDER — IBUPROFEN 600 MG PO TABS: 600.0000 mg | ORAL_TABLET | Freq: Four times a day (QID) | ORAL | Status: DC | PRN

## 2014-04-15 MED ORDER — KETOROLAC TROMETHAMINE 30 MG/ML IJ SOLN: 15.0000 mg | Freq: Once | INTRAMUSCULAR | Status: DC | PRN

## 2014-04-15 MED ORDER — MEPERIDINE HCL 25 MG/ML IJ SOLN: 6.2500 mg | INTRAMUSCULAR | Status: DC | PRN

## 2014-04-15 MED ORDER — PHENYLEPHRINE HCL 10 MG/ML IJ SOLN: INTRAMUSCULAR | Status: DC | PRN

## 2014-04-15 MED ORDER — ACETAMINOPHEN 160 MG/5ML PO SOLN: 325.0000 mg | ORAL | Status: DC | PRN

## 2014-04-15 MED ORDER — KETOROLAC TROMETHAMINE 30 MG/ML IJ SOLN
INTRAMUSCULAR | Status: AC
  Filled 2014-04-15: qty 1

## 2014-04-15 MED ORDER — MIDAZOLAM HCL 2 MG/2ML IJ SOLN
INTRAMUSCULAR | Status: AC
  Filled 2014-04-15: qty 2

## 2014-04-15 MED ORDER — ONDANSETRON HCL 4 MG/2ML IJ SOLN
INTRAMUSCULAR | Status: AC
  Filled 2014-04-15: qty 2

## 2014-04-15 MED ORDER — OXYCODONE-ACETAMINOPHEN 5-325 MG PO TABS: 1.0000 | ORAL_TABLET | Freq: Four times a day (QID) | ORAL | Status: DC | PRN

## 2014-04-15 MED ORDER — ACETAMINOPHEN 325 MG PO TABS: 325.0000 mg | ORAL_TABLET | ORAL | Status: DC | PRN

## 2014-04-15 MED ORDER — LIDOCAINE HCL (CARDIAC) 20 MG/ML IV SOLN
INTRAVENOUS | Status: AC
  Filled 2014-04-15: qty 5

## 2014-04-15 MED ORDER — PROPOFOL 10 MG/ML IV EMUL
INTRAVENOUS | Status: DC | PRN
  Administered 2014-04-15 (×4): 50 mg via INTRAVENOUS

## 2014-04-15 MED ORDER — FENTANYL CITRATE 0.05 MG/ML IJ SOLN
INTRAMUSCULAR | Status: DC | PRN
  Administered 2014-04-15: 100 ug via INTRAVENOUS

## 2014-04-15 MED ORDER — METOCLOPRAMIDE HCL 5 MG/ML IJ SOLN: 10.0000 mg | Freq: Once | INTRAMUSCULAR | Status: DC | PRN

## 2014-04-15 MED ORDER — PROPOFOL 10 MG/ML IV EMUL
INTRAVENOUS | Status: AC
  Filled 2014-04-15: qty 20

## 2014-04-15 MED ORDER — LIDOCAINE HCL 2 % IJ SOLN
INTRAMUSCULAR | Status: DC | PRN
  Administered 2014-04-15: 10 mL

## 2014-04-15 MED ORDER — MIDAZOLAM HCL 2 MG/2ML IJ SOLN
INTRAMUSCULAR | Status: DC | PRN
  Administered 2014-04-15: 2 mg via INTRAVENOUS

## 2014-04-15 SURGICAL SUPPLY — 22 items
CATH ROBINSON RED A/P 16FR (CATHETERS) ×3 IMPLANT
CLOTH BEACON ORANGE TIMEOUT ST (SAFETY) ×3 IMPLANT
DECANTER SPIKE VIAL GLASS SM (MISCELLANEOUS) ×3 IMPLANT
DRAPE HYSTEROSCOPY (DRAPE) ×2 IMPLANT
GLOVE BIO SURGEON STRL SZ7.5 (GLOVE) ×3 IMPLANT
GLOVE BIOGEL PI IND STRL 7.5 (GLOVE) ×2 IMPLANT
GLOVE BIOGEL PI INDICATOR 7.5 (GLOVE) ×4
GOWN STRL REUS W/TWL LRG LVL3 (GOWN DISPOSABLE) ×6 IMPLANT
KIT BERKELEY 1ST TRIMESTER 3/8 (MISCELLANEOUS) ×3 IMPLANT
NDL SPNL 22GX3.5 QUINCKE BK (NEEDLE) ×1 IMPLANT
NEEDLE SPNL 22GX3.5 QUINCKE BK (NEEDLE) ×3 IMPLANT
NS IRRIG 1000ML POUR BTL (IV SOLUTION) ×3 IMPLANT
PACK VAGINAL MINOR WOMEN LF (CUSTOM PROCEDURE TRAY) ×3 IMPLANT
PAD OB MATERNITY 4.3X12.25 (PERSONAL CARE ITEMS) ×3 IMPLANT
PAD PREP 24X48 CUFFED NSTRL (MISCELLANEOUS) ×3 IMPLANT
SET BERKELEY SUCTION TUBING (SUCTIONS) ×3 IMPLANT
SYR CONTROL 10ML LL (SYRINGE) ×3 IMPLANT
TOWEL OR 17X24 6PK STRL BLUE (TOWEL DISPOSABLE) ×6 IMPLANT
VACURETTE 10 RIGID CVD (CANNULA) IMPLANT
VACURETTE 7MM CVD STRL WRAP (CANNULA) IMPLANT
VACURETTE 8 RIGID CVD (CANNULA) IMPLANT
VACURETTE 9 RIGID CVD (CANNULA) IMPLANT

## 2014-04-15 NOTE — Transfer of Care (Signed)
Immediate Anesthesia Transfer of Care Note  Patient: Cynthia Henderson  Procedure(s) Performed: Procedure(s): DILATATION AND EVACUATION (N/A)  Patient Location: PACU  Anesthesia Type:MAC  Level of Consciousness: awake, alert  and oriented  Airway & Oxygen Therapy: Patient Spontanous Breathing  Post-op Assessment: Report given to PACU RN and Post -op Vital signs reviewed and stable  Post vital signs: Reviewed and stable  Complications: No apparent anesthesia complications

## 2014-04-15 NOTE — MAU Note (Signed)
Urine in lab 

## 2014-04-15 NOTE — Discharge Instructions (Signed)
DISCHARGE INSTRUCTIONS: D&C  The following instructions have been prepared to help you care for yourself upon your return home.  MAY TAKE IBUPROFEN (MOTRIN, ADVIL) OR ALEVE AFTER 11:20 PM FOR CRAMPS!!!   Personal hygiene:  Use sanitary pads for vaginal drainage, not tampons.  Shower the day after your procedure.  NO tub baths, pools or Jacuzzis for 2-3 weeks.  Wipe front to back after using the bathroom.  Activity and limitations:  Do NOT drive or operate any equipment for 24 hours. The effects of anesthesia are still present and drowsiness may result.  Do NOT rest in bed all day.  Walking is encouraged.  Walk up and down stairs slowly.  You may resume your normal activity in one to two days or as indicated by your physician.  Sexual activity: NO intercourse for at least 2 weeks after the procedure, or as indicated by your physician.  Diet: Eat a light meal as desired this evening. You may resume your usual diet tomorrow.  Return to work: You may resume your work activities in one to two days or as indicated by your doctor.  What to expect after your surgery: Expect to have vaginal bleeding/discharge for 2-3 days and spotting for up to 10 days. It is not unusual to have soreness for up to 1-2 weeks. You may have a slight burning sensation when you urinate for the first day. Mild cramps may continue for a couple of days. You may have a regular period in 2-6 weeks.  Call your doctor for any of the following:  Excessive vaginal bleeding, saturating and changing one pad every hour.  Inability to urinate 6 hours after discharge from hospital.  Pain not relieved by pain medication.  Fever of 100.4 F or greater.  Unusual vaginal discharge or odor.   Call for an appointment:    Patients signature: ______________________  Nurses signature ________________________  Support person's signature_______________________

## 2014-04-15 NOTE — Anesthesia Preprocedure Evaluation (Addendum)
Anesthesia Evaluation  Patient identified by MRN, date of birth, ID band Patient awake    Reviewed: Allergy & Precautions, H&P , NPO status , Patient's Chart, lab work & pertinent test results, reviewed documented beta blocker date and time   History of Anesthesia Complications Negative for: history of anesthetic complications  Airway Mallampati: I TM Distance: >3 FB Neck ROM: full    Dental  (+) Teeth Intact   Pulmonary neg pulmonary ROS,  breath sounds clear to auscultation  Pulmonary exam normal       Cardiovascular negative cardio ROS  Rhythm:regular     Neuro/Psych  Headaches, negative psych ROS   GI/Hepatic negative GI ROS, Neg liver ROS,   Endo/Other  negative endocrine ROS  Renal/GU negative Renal ROS  negative genitourinary   Musculoskeletal   Abdominal Normal abdominal exam  (+)   Peds  Hematology negative hematology ROS (+)   Anesthesia Other Findings   Reproductive/Obstetrics (+) Pregnancy (missed ab at 7 weeks)                          Anesthesia Physical Anesthesia Plan  ASA: II  Anesthesia Plan: MAC   Post-op Pain Management:    Induction:   Airway Management Planned:   Additional Equipment:   Intra-op Plan:   Post-operative Plan:   Informed Consent: I have reviewed the patients History and Physical, chart, labs and discussed the procedure including the risks, benefits and alternatives for the proposed anesthesia with the patient or authorized representative who has indicated his/her understanding and acceptance.     Plan Discussed with: Surgeon and CRNA  Anesthesia Plan Comments:         Anesthesia Quick Evaluation

## 2014-04-15 NOTE — H&P (Signed)
Cynthia Henderson is an 33 y.o. female. Patient sent from office after US for first trimester revealed an anteverted uterus  previously seen fetal pole and calcified yolk sac not identified today and 7w 4D gestational sac is seen. ovaries and adnexas are within normal limits.  Patient given option of observation, cytotec, and D&E.  Patient opted for D&E.    Pertinent Gynecological History: OB History: G3 P1021 @ 12.4wks based on LMP   Menstrual History: Patient's last menstrual period was 01/17/2014.    Past Medical History  Diagnosis Date  . Anemia     post D @ E  . Headache(784.0)     migraine  . Prolonged rupture of membranes 07/20/2012    Past Surgical History  Procedure Laterality Date  . Dilation and curettage of uterus  2012  . Wisdom tooth extraction  2007  . Dilation and curettage of uterus      Family History  Problem Relation Age of Onset  . Migraines Brother   . Spina bifida Cousin   . Cancer Father   . Stroke Maternal Grandfather     Social History:  reports that she has never smoked. She has never used smokeless tobacco. She reports that she does not drink alcohol or use illicit drugs.  Allergies:  Allergies  Allergen Reactions  . Penicillins Hives  . Penicillins Hives    Prescriptions prior to admission  Medication Sig Dispense Refill  . docusate sodium (COLACE) 100 MG capsule Take 100 mg by mouth 2 (two) times daily.      . norethindrone (ORTHO MICRONOR) 0.35 MG tablet Take 1 tablet (0.35 mg total) by mouth daily.  1 Package  11  . Prenatal Vit-Fe Fumarate-FA (PRENATAL MULTIVITAMIN) TABS Take 1 tablet by mouth daily.      . Prenatal Vit-Fe Fumarate-FA (PRENATAL MULTIVITAMIN) TABS Take 1 tablet by mouth at bedtime.        ROS  Blood pressure 117/73, pulse 97, resp. rate 16, height 5\' 4"  (1.626 m), weight 102 lb 6.4 oz (46.448 kg), last menstrual period 01/17/2014, SpO2 100.00%, currently breastfeeding. Physical Exam Labs: O Positive/Negative H/H:  13.1/38.5 Plts: 273 HIV: Negative  Assessment: P1 at 12 4/7wks with Missed Abortion dx'd on u/s in office today.  Plan:  Suction D&C.  R/B/A discussed with patient, patient verbalized understanding and consent signed and witnessed.  Questions answered.  Gerrit HeckJessica Henderson 04/15/2014, 2:35 PM

## 2014-04-15 NOTE — Anesthesia Postprocedure Evaluation (Signed)
  Anesthesia Post-op Note  Anesthesia Post Note  Patient: Cynthia Henderson  Procedure(s) Performed: Procedure(s) (LRB): DILATATION AND EVACUATION (N/A)  Anesthesia type: MAC  Patient location: PACU  Post pain: Pain level controlled  Post assessment: Post-op Vital signs reviewed  Last Vitals:  Filed Vitals:   04/15/14 1735  BP: 98/51  Pulse: 94  Temp: 36.7 C  Resp: 18    Post vital signs: Reviewed  Level of consciousness: sedated  Complications: No apparent anesthesia complications

## 2014-04-15 NOTE — MAU Note (Signed)
Patient states she has an incomplete SAB and is scheduled for a D & E at 1530. Patient was seen in the office this am and sent to MAU for prep for OR. Patient denies pain but has some spotting.

## 2014-04-15 NOTE — Op Note (Signed)
Preop Diagnosis: missed abortion   Postop Diagnosis: missed abortion   Procedure: SUCTION DILATION AND CURETTAGE   Anesthesia: Monitor Anesthesia Care   Anesthesiologist: Dana AllanAmy Cassidy, MD   Attending: Purcell NailsAngela Y Gaynell Eggleton, MD   Assistant: n/a  Findings: Uterus sounded to 10CM  Pathology: POCs  Fluids: 600 cc  UOP: 100 cc via straight cath   EBL: Minimal  Complications: None  Procedure: The patient was taken to the operating room after the risks benefits and alternatives were discussed with the patient, the patient verbalized understanding and consent signed and witnessed.  The patient was placed under MAC anesthesia, prepped and draped in the normal sterile fashion and a time out was performed.  A bivalve speculum was placed in the patient's vagina and the anterior lip of the cervix grasped with a single-tooth tenaculum. A paracervical block was administered using a total of 10 cc of 2% lidocaine.  The uterus was sounded to 10 cm and a size 8 suction curette was used. Suction curettage was performed until minimal tissue returned. Sharp curettage was performed until a gritty texture was noted. Suction curettage was performed once again to remove any remaining debris. All instruments were removed. The count was correct. The patient was transferred to the recovery room in good condition.

## 2014-04-18 ENCOUNTER — Encounter (HOSPITAL_COMMUNITY): Payer: Self-pay | Admitting: Obstetrics and Gynecology

## 2014-09-20 LAB — OB RESULTS CONSOLE GC/CHLAMYDIA
CHLAMYDIA, DNA PROBE: NEGATIVE
Gonorrhea: NEGATIVE

## 2014-10-31 ENCOUNTER — Encounter (HOSPITAL_COMMUNITY): Payer: Self-pay | Admitting: Obstetrics and Gynecology

## 2014-12-30 NOTE — L&D Delivery Note (Cosign Needed)
Delivery Note At 12:23 AM a viable female "Cynthia Henderson" was delivered via Vaginal, Spontaneous Delivery (Presentation: OA restituting to Left Occiput Anterior).  APGARS: 8, 9; weight pending.   Placenta status: Intact, Spontaneous.  Cord: 3 vessels with the following complications: Battledore insertion. Cord pH: NA  Cord blood donor - sample collected.  Anesthesia: Epidural  Episiotomy: None Lacerations: Split of left labial (from previous delivery per pt report) - repaired for cosmesis.  Suture Repair: vicryl 4-0 SH Est. Blood Loss (mL): 150  Mom to postpartum.  Baby to Couplet care / Skin to Skin.  Mom plans to breastfeed.  FOB plans vasectomy.  Couple plans inpatient circumcision. R&B of circumcision reviewed w/ pt and spouse. Consent obtained and placed in binder.    Sherre ScarletWILLIAMS, Dustie Brittle 04/10/2015, 1:17 AM

## 2015-03-04 LAB — OB RESULTS CONSOLE HEPATITIS B SURFACE ANTIGEN: Hepatitis B Surface Ag: NEGATIVE

## 2015-03-04 LAB — OB RESULTS CONSOLE RUBELLA ANTIBODY, IGM: Rubella: IMMUNE

## 2015-03-04 LAB — OB RESULTS CONSOLE HIV ANTIBODY (ROUTINE TESTING): HIV: NONREACTIVE

## 2015-03-04 LAB — OB RESULTS CONSOLE ANTIBODY SCREEN: ANTIBODY SCREEN: NEGATIVE

## 2015-03-04 LAB — OB RESULTS CONSOLE ABO/RH: RH Type: POSITIVE

## 2015-03-04 LAB — OB RESULTS CONSOLE RPR: RPR: NONREACTIVE

## 2015-03-20 LAB — OB RESULTS CONSOLE GBS: STREP GROUP B AG: NEGATIVE

## 2015-04-09 ENCOUNTER — Inpatient Hospital Stay (HOSPITAL_COMMUNITY): Payer: BC Managed Care – PPO | Admitting: Anesthesiology

## 2015-04-09 ENCOUNTER — Inpatient Hospital Stay (HOSPITAL_COMMUNITY): Payer: BC Managed Care – PPO

## 2015-04-09 ENCOUNTER — Inpatient Hospital Stay (HOSPITAL_COMMUNITY)
Admission: AD | Admit: 2015-04-09 | Discharge: 2015-04-11 | DRG: 775 | Disposition: A | Discharge status: Home / Self Care | Payer: BC Managed Care – PPO | Source: Ambulatory Visit | Attending: Obstetrics and Gynecology | Admitting: Obstetrics and Gynecology

## 2015-04-09 ENCOUNTER — Encounter (HOSPITAL_COMMUNITY): Payer: Self-pay | Admitting: *Deleted

## 2015-04-09 DIAGNOSIS — N858 Other specified noninflammatory disorders of uterus: Secondary | ICD-10-CM | POA: Diagnosis present

## 2015-04-09 DIAGNOSIS — J302 Other seasonal allergic rhinitis: Secondary | ICD-10-CM

## 2015-04-09 DIAGNOSIS — O368190 Decreased fetal movements, unspecified trimester, not applicable or unspecified: Secondary | ICD-10-CM

## 2015-04-09 DIAGNOSIS — Z881 Allergy status to other antibiotic agents status: Secondary | ICD-10-CM

## 2015-04-09 DIAGNOSIS — Z3A39 39 weeks gestation of pregnancy: Secondary | ICD-10-CM | POA: Diagnosis present

## 2015-04-09 DIAGNOSIS — O2240 Hemorrhoids in pregnancy, unspecified trimester: Secondary | ICD-10-CM

## 2015-04-09 DIAGNOSIS — O368131 Decreased fetal movements, third trimester, fetus 1: Secondary | ICD-10-CM

## 2015-04-09 DIAGNOSIS — Z880 Allergy status to penicillin: Secondary | ICD-10-CM

## 2015-04-09 DIAGNOSIS — O9902 Anemia complicating childbirth: Secondary | ICD-10-CM | POA: Diagnosis present

## 2015-04-09 DIAGNOSIS — O2243 Hemorrhoids in pregnancy, third trimester: Secondary | ICD-10-CM | POA: Diagnosis present

## 2015-04-09 DIAGNOSIS — O034 Incomplete spontaneous abortion without complication: Secondary | ICD-10-CM

## 2015-04-09 DIAGNOSIS — O09293 Supervision of pregnancy with other poor reproductive or obstetric history, third trimester: Secondary | ICD-10-CM

## 2015-04-09 DIAGNOSIS — Z862 Personal history of diseases of the blood and blood-forming organs and certain disorders involving the immune mechanism: Secondary | ICD-10-CM

## 2015-04-09 LAB — CBC
HCT: 36.6 % (ref 36.0–46.0)
Hemoglobin: 12.5 g/dL (ref 12.0–15.0)
MCH: 30.9 pg (ref 26.0–34.0)
MCHC: 34.2 g/dL (ref 30.0–36.0)
MCV: 90.6 fL (ref 78.0–100.0)
Platelets: 239 10*3/uL (ref 150–400)
RBC: 4.04 MIL/uL (ref 3.87–5.11)
RDW: 13.4 % (ref 11.5–15.5)
WBC: 12.8 10*3/uL — ABNORMAL HIGH (ref 4.0–10.5)

## 2015-04-09 LAB — TYPE AND SCREEN
ABO/RH(D): O POS
Antibody Screen: NEGATIVE

## 2015-04-09 MED ORDER — EPHEDRINE 5 MG/ML INJ: 10.0000 mg | INTRAVENOUS | Status: DC | PRN

## 2015-04-09 MED ORDER — LACTATED RINGERS IV SOLN: 500.0000 mL | Freq: Once | INTRAVENOUS | Status: DC

## 2015-04-09 MED ORDER — LIDOCAINE HCL (PF) 1 % IJ SOLN: 30.0000 mL | INTRAMUSCULAR | Status: DC | PRN

## 2015-04-09 MED ORDER — LACTATED RINGERS IV SOLN
INTRAVENOUS | Status: DC
  Administered 2015-04-09: 19:00:00 via INTRAVENOUS

## 2015-04-09 MED ORDER — DIPHENHYDRAMINE HCL 50 MG/ML IJ SOLN: 12.5000 mg | INTRAMUSCULAR | Status: DC | PRN

## 2015-04-09 MED ORDER — LIDOCAINE HCL (PF) 1 % IJ SOLN
INTRAMUSCULAR | Status: DC | PRN
  Administered 2015-04-09: 4 mL
  Administered 2015-04-09: 6 mL

## 2015-04-09 MED ORDER — OXYCODONE-ACETAMINOPHEN 5-325 MG PO TABS: 1.0000 | ORAL_TABLET | ORAL | Status: DC | PRN

## 2015-04-09 MED ORDER — FENTANYL 2.5 MCG/ML BUPIVACAINE 1/10 % EPIDURAL INFUSION (WH - ANES)
14.0000 mL/h | INTRAMUSCULAR | Status: DC | PRN
  Filled 2015-04-09: qty 125

## 2015-04-09 MED ORDER — PHENYLEPHRINE 40 MCG/ML (10ML) SYRINGE FOR IV PUSH (FOR BLOOD PRESSURE SUPPORT): 80.0000 ug | PREFILLED_SYRINGE | INTRAVENOUS | Status: DC | PRN

## 2015-04-09 MED ORDER — OXYCODONE-ACETAMINOPHEN 5-325 MG PO TABS: 2.0000 | ORAL_TABLET | ORAL | Status: DC | PRN

## 2015-04-09 MED ORDER — CITRIC ACID-SODIUM CITRATE 334-500 MG/5ML PO SOLN: 30.0000 mL | ORAL | Status: DC | PRN

## 2015-04-09 MED ORDER — ACETAMINOPHEN 325 MG PO TABS: 650.0000 mg | ORAL_TABLET | ORAL | Status: DC | PRN

## 2015-04-09 MED ORDER — FENTANYL 2.5 MCG/ML BUPIVACAINE 1/10 % EPIDURAL INFUSION (WH - ANES)
INTRAMUSCULAR | Status: DC | PRN
  Administered 2015-04-09: 14 mL/h via EPIDURAL

## 2015-04-09 MED ORDER — NALBUPHINE HCL 10 MG/ML IJ SOLN: 5.0000 mg | INTRAMUSCULAR | Status: DC | PRN

## 2015-04-09 MED ORDER — ONDANSETRON HCL 4 MG/2ML IJ SOLN: 4.0000 mg | Freq: Four times a day (QID) | INTRAMUSCULAR | Status: DC | PRN

## 2015-04-09 MED ORDER — OXYTOCIN 40 UNITS IN LACTATED RINGERS INFUSION - SIMPLE MED
62.5000 mL/h | INTRAVENOUS | Status: DC
  Filled 2015-04-09: qty 1000

## 2015-04-09 MED ORDER — LACTATED RINGERS IV SOLN: 500.0000 mL | INTRAVENOUS | Status: DC | PRN

## 2015-04-09 MED ORDER — FLEET ENEMA 7-19 GM/118ML RE ENEM: 1.0000 | ENEMA | RECTAL | Status: DC | PRN

## 2015-04-09 MED ORDER — PHENYLEPHRINE 40 MCG/ML (10ML) SYRINGE FOR IV PUSH (FOR BLOOD PRESSURE SUPPORT)
80.0000 ug | PREFILLED_SYRINGE | INTRAVENOUS | Status: DC | PRN
  Filled 2015-04-09: qty 20

## 2015-04-09 MED ORDER — OXYTOCIN BOLUS FROM INFUSION
500.0000 mL | INTRAVENOUS | Status: DC
  Administered 2015-04-10: 500 mL via INTRAVENOUS

## 2015-04-09 NOTE — Anesthesia Procedure Notes (Signed)
Epidural Patient location during procedure: OB Start time: 04/09/2015 9:42 PM End time: 04/09/2015 9:59 PM  Staffing Anesthesiologist: Sebastian AcheMANNY, Leina Babe Performed by: anesthesiologist   Preanesthetic Checklist Completed: patient identified, site marked, surgical consent, pre-op evaluation, timeout performed, IV checked, risks and benefits discussed and monitors and equipment checked  Epidural Patient position: sitting Prep: site prepped and draped and DuraPrep Patient monitoring: heart rate, continuous pulse ox and blood pressure Approach: midline Location: L3-L4 Injection technique: LOR air  Needle:  Needle type: Tuohy  Needle gauge: 17 G Needle length: 9 cm and 9 Needle insertion depth: 4.4 cm Catheter type: closed end flexible Catheter size: 19 Gauge Catheter at skin depth: 13 cm Test dose: negative  Assessment Events: blood not aspirated, injection not painful, no injection resistance, negative IV test and no paresthesia  Additional Notes   Patient tolerated the insertion well without complications.Reason for block:procedure for pain

## 2015-04-09 NOTE — MAU Provider Note (Cosign Needed)
Cynthia MouseSarah Henderson is a 34 y.o. G5P1021 at 39.1 weeks c/o decreased fetal movement since this morning.  Denies vb, lof or ctx   History     Patient Active Problem List   Diagnosis Date Noted  . Vaginal delivery 07/20/2012  . Postcoital bleeding 05/20/2012  . Penicillin allergy 04/15/2012    Chief Complaint  Patient presents with  . Decreased Fetal Movement   HPI  OB History    Gravida Para Term Preterm AB TAB SAB Ectopic Multiple Living   5 1 1  2  2   1       Past Medical History  Diagnosis Date  . Anemia     post D @ E  . Headache(784.0)     migraine  . Prolonged rupture of membranes 07/20/2012    Past Surgical History  Procedure Laterality Date  . Dilation and curettage of uterus  2012  . Wisdom tooth extraction  2007  . Dilation and curettage of uterus    . Dilation and evacuation N/A 04/15/2014    Procedure: DILATATION AND EVACUATION;  Surgeon: Purcell NailsAngela Y Roberts, MD;  Location: [facility];  Service: Gynecology;  Laterality: N/A;    Family History  Problem Relation Age of Onset  . Migraines Brother   . Spina bifida Cousin   . Cancer Father   . Stroke Maternal Grandfather     History  Substance Use Topics  . Smoking status: Never Smoker   . Smokeless tobacco: Never Used  . Alcohol Use: No    Allergies:  Allergies  Allergen Reactions  . Penicillins Hives  . Azithromycin Rash    Prescriptions prior to admission  Medication Sig Dispense Refill Last Dose  . calcium carbonate (TUMS - DOSED IN MG ELEMENTAL CALCIUM) 500 MG chewable tablet Chew 2 tablets by mouth 4 (four) times daily as needed for indigestion or heartburn.   04/08/2015 at Unknown time  . cetirizine (ZYRTEC) 10 MG tablet Take 10 mg by mouth daily as needed for allergies.    PRN  . Prenatal Vit-Fe Fumarate-FA (PRENATAL MULTIVITAMIN) TABS tablet Take 1 tablet by mouth daily.   04/08/2015 at Unknown time  . ibuprofen (ADVIL,MOTRIN) 600 MG tablet Take 1 tablet (600 mg total) by mouth every 6 (six) hours as  needed. (Patient not taking: Reported on 04/09/2015) 30 tablet 0   . oxyCODONE-acetaminophen (PERCOCET) 5-325 MG per tablet Take 1-2 tablets by mouth every 6 (six) hours as needed for severe pain. (Patient not taking: Reported on 04/09/2015) 30 tablet 0     ROS See HPI above, all other systems are negative  Physical Exam   Blood pressure 128/85, pulse 95, temperature 98.1 F (36.7 C), resp. rate 18, last menstrual period 07/09/2014, unknown if currently breastfeeding.  Physical Exam Ext:  WNL ABD: Soft, non tender to palpation, no rebound or guarding SVE:3/70/-1 vertex   ED Course  Assessment: IUP at  39.1weeks Membranes: intact FHR: Category 1 CTX:  2-3 minutes not felt by pt   Plan: BPP   Joan Avetisyan, CNM, MSN 04/09/2015. 4:59 PM

## 2015-04-09 NOTE — H&P (Cosign Needed)
Cynthia Henderson is a 34 y.o. female, G4P1021 at 39.1 weeks, presenting to MAU originally for decreased FM. Cat 1 tracing noted. Pt sent to U/S for BPP, results 6/8 per RN. After BPP pt reported +FM. Ctxs q 2-3 min but states did not feel them until her cvx was examined. Cvx reportedly 3/70/-1. I assumed care of pt at 7 pm and noted her cvx to be 4-5/70/-1 w/ a BBOW. Ctxs q 2-3 min. She was subsequently admitted to [facility].    Patient Active Problem List   Diagnosis Date Noted  . Normal labor 04/09/2015  . Incomplete spontaneous abortion - D&C 04/29/14 04/09/2015  . Hemorrhoids during pregnancy 04/09/2015  . History of anemia 04/09/2015  . Penicillin allergy 04/15/2012    History of present pregnancy: Patient entered care at 10.3 weeks.   EDC of 04/15/15 was established by sure LMP.   Anatomy scan: 18.4 weeks, with normal findings and an anterior placenta.   Additional [place] evaluations: None.   Significant prenatal events:  Declined genetic testing. Received flu vaccine on 09/20/14 and Tdap on 02/02/15. Early common discomforts of pregnancy, heartburn at 26.5 wks - treated w/ OTC meds. Hemorrhoids at 29.5 wks, treated w/ OTC med. Ear infection at 31 wks, treated w/ ear drops by PCM. Pelvic pressure and decreased FM around 28 wks. Reviewed kick counts and comfort measures for pelvic pressure. TWG 33 lbs. Decreased FM on 04/09/15 - to MAU for evaluation.   Last evaluation: Office 04/05/15 by Dr. Su Hilt @ 38.4 wks. Cvx 3/70/-2. FHR 144 bpm. BP 110/60.  OB History    Gravida Para Term Preterm AB TAB SAB Ectopic Multiple Living   SAB in 01/2011 @ 5 wks SVD on 07/20/2012 @ 38 wks, 18-hr labor, female infant, birthwt 7lbs 7 ozs, local anesthesia, [facility], no complications "Samuel Bouche" SAB on 04/29/2014 - D&C - Anemia post procedure Past Medical History  Diagnosis Date  . Anemia     post D @ E  . Headache(784.0)     migraine  . Prolonged rupture of membranes 07/20/2012   Past Surgical History   Procedure Laterality Date  . Dilation and curettage of uterus  2012  . Wisdom tooth extraction  2007  . Dilation and curettage of uterus    . Dilation and evacuation N/A 04/15/2014    Procedure: DILATATION AND EVACUATION;  Surgeon: Purcell Nails, MD;  Location: [facility];  Service: Gynecology;  Laterality: N/A;   Family History: family history includes Cancer in her father; Migraines in her brother; Spina bifida in her cousin; Stroke in her maternal grandfather.Hypercholesterolemia in her mother and Alzheimer's in her MGF. Social History:  reports that she has never smoked. She has never used smokeless tobacco. She reports that she does not drink alcohol or use illicit drugs.Pt is a Caucasian female with a post graduate degree, employed as a Engineer, site. She is married to Casimiro Needle and is of the [employer].    Prenatal Transfer Tool  Maternal Diabetes: No Genetic Screening: Declined Maternal Ultrasounds/Referrals: Normal Fetal Ultrasounds or other Referrals:  None Maternal Substance Abuse:  No Significant Maternal Medications:  Meds include: Other: PNVs Significant Maternal Lab Results: Lab values include: Group B Strep negative  TDAP: 02/02/15 Flu: 09/20/14  ROS: +FM, +ctxs, -VB, -LOF  Allergies  Allergen Reactions  . Penicillins Hives  . Azithromycin Rash     Dilation: 6.5 Effacement (%): 80 Station: -1 Exam by:: [employer]  RN Blood pressure 134/75, pulse 87, temperature 98.2 F (36.8 C), temperature source Oral, resp. rate 18, height 5\' 4"  (1.626 m), weight 139 lb (63.05 kg), last menstrual period 07/09/2014, SpO2 98 %, unknown if currently breastfeeding.  Chest clear Heart RRR without murmur Abd gravid, NT, FH CWD Pelvic: 4-5/70/-1, BBOW Cephalic by Leopolds and VE EFW: 7 lbs Bishop score: 8 Ext: WNL  FHR: Category 1 w/ change in baseline greater than 10 min, resolved w/ IVFs UCs:  q 2-3 min, palpate moderate  Prenatal labs: ABO, Rh: --/--/O POS (04/10  1919) Antibody: NEG (04/10 1919) Rubella: Immune RPR: Nonreactive (03/05 0000)  HBsAg: Negative (03/05 0000)  HIV: Non-reactive (03/05 0000)  GBS: Negative (03/21 0000) Sickle cell/Hgb electrophoresis: NA Pap: NA GC: Neg on 09/20/14 and 03/20/15 Chlamydia: Neg on 09/20/14 and 03/20/15 Genetic screenings: Declined Glucola: Normal at 80  Hgb 13.2 at NOB, 12.3 at 28 weeks Results for orders placed or performed during the hospital encounter of 04/09/15 (from the past 24 hour(s))  CBC     Status: Abnormal   Collection Time: 04/09/15  7:19 PM  Result Value Ref Range   WBC 12.8 (H) 4.0 - 10.5 K/uL   RBC 4.04 3.87 - 5.11 MIL/uL   Hemoglobin 12.5 12.0 - 15.0 g/dL   HCT 11.936.6 14.736.0 - 82.946.0 %   MCV 90.6 78.0 - 100.0 fL   MCH 30.9 26.0 - 34.0 pg   MCHC 34.2 30.0 - 36.0 g/dL   RDW 56.213.4 13.011.5 - 86.515.5 %   Platelets 239 150 - 400 K/uL  Type and screen     Status: None   Collection Time: 04/09/15  7:19 PM  Result Value Ref Range   ABO/RH(D) O POS    Antibody Screen NEG    Sample Expiration 04/12/2015     Assessment: IUP at 39.1 wks Progressive labor Cat 1 FHRT BBOW GBS neg  Plan: Admit to Birthing Suite Routine [facility] orders Pain med/epidural prn Expect SVD  Sherre ScarletWILLIAMS, Goldie Dimmer CNM, MS 04/09/2015, 07:11 PM  Of note, [facility] Change in FHR baseline from 150 bpm to 170 bpm for greater than 10 min. IV bolus given and FHR returned to baseline of 150 bpm.   Sherre ScarletKimberly Adesuwa Osgood, CNM 04/09/15, 9:30 PM

## 2015-04-09 NOTE — MAU Note (Signed)
Pt presents to MAU with complaints of a decrease in fetal movement today. Denies any vaginal bleeding or LOF. 

## 2015-04-09 NOTE — Anesthesia Preprocedure Evaluation (Signed)
Anesthesia Evaluation  Patient identified by MRN, date of birth, ID band Patient awake and Patient confused    Reviewed: Allergy & Precautions, H&P , NPO status , Patient's Chart, lab work & pertinent test results  Airway Mallampati: II   Neck ROM: full    Dental   Pulmonary  breath sounds clear to auscultation  Pulmonary exam normal       Cardiovascular Exercise Tolerance: Good Rhythm:regular Rate:Normal     Neuro/Psych    GI/Hepatic   Endo/Other    Renal/GU      Musculoskeletal   Abdominal Normal abdominal exam  (+)   Peds  Hematology   Anesthesia Other Findings   Reproductive/Obstetrics (+) Pregnancy                             Anesthesia Physical Anesthesia Plan  ASA: II  Anesthesia Plan: Epidural   Post-op Pain Management:    Induction:   Airway Management Planned:   Additional Equipment:   Intra-op Plan:   Post-operative Plan:   Informed Consent: I have reviewed the patients History and Physical, chart, labs and discussed the procedure including the risks, benefits and alternatives for the proposed anesthesia with the patient or authorized representative who has indicated his/her understanding and acceptance.     Plan Discussed with:   Anesthesia Plan Comments:         Anesthesia Quick Evaluation

## 2015-04-09 NOTE — Progress Notes (Signed)
  [facility] Received epidural and is now comfortable. Feels some pressure. Spouse at bedside.  Objective: BP 105/69 mmHg  Pulse 105  Temp(Src) 98.2 F (36.8 C) (Oral)  Resp 18  Ht 5\' 4"  (1.626 m)  Wt 139 lb (63.05 kg)  BMI 23.85 kg/m2  SpO2 98%  LMP 07/09/2014     Today's Vitals   04/09/15 2231 04/09/15 2255 04/09/15 2301 04/09/15 2331  BP: 134/75  116/66 105/69  Pulse: 87  143 105  Temp:  98.2 F (36.8 C)    TempSrc:  Oral    Resp:    18  Height:      Weight:      SpO2:      PainSc:  0-No pain  0-No pain   FHT: BL 135 w/ moderate variability, +accels, mild variables, earlys, no lates UC:   irregular, every 2-3 minutes SVE: C/C/+2 SROM'd 22:08, moderate amount of clear fluid   Assessment:  2nd stage labor Cat 2 FHRT  Plan: Commence w/ pushing Expect SVD briefly   Sherre ScarletWILLIAMS, Cynthia Henderson CNM 04/10/15, 00:10 AM

## 2015-04-10 ENCOUNTER — Encounter (HOSPITAL_COMMUNITY): Payer: Self-pay | Admitting: General Practice

## 2015-04-10 LAB — CBC
HCT: 31.3 % — ABNORMAL LOW (ref 36.0–46.0)
Hemoglobin: 10.6 g/dL — ABNORMAL LOW (ref 12.0–15.0)
MCH: 30.3 pg (ref 26.0–34.0)
MCHC: 33.9 g/dL (ref 30.0–36.0)
MCV: 89.4 fL (ref 78.0–100.0)
PLATELETS: 197 10*3/uL (ref 150–400)
RBC: 3.5 MIL/uL — AB (ref 3.87–5.11)
RDW: 13.4 % (ref 11.5–15.5)
WBC: 18 10*3/uL — ABNORMAL HIGH (ref 4.0–10.5)

## 2015-04-10 LAB — RPR: RPR Ser Ql: NONREACTIVE

## 2015-04-10 MED ORDER — OXYCODONE-ACETAMINOPHEN 5-325 MG PO TABS: 1.0000 | ORAL_TABLET | ORAL | Status: DC | PRN

## 2015-04-10 MED ORDER — DIBUCAINE 1 % RE OINT
1.0000 "application " | TOPICAL_OINTMENT | RECTAL | Status: DC | PRN
  Administered 2015-04-10: 1 via RECTAL
  Filled 2015-04-10 (×2): qty 28

## 2015-04-10 MED ORDER — ONDANSETRON HCL 4 MG PO TABS: 4.0000 mg | ORAL_TABLET | ORAL | Status: DC | PRN

## 2015-04-10 MED ORDER — SIMETHICONE 80 MG PO CHEW: 80.0000 mg | CHEWABLE_TABLET | ORAL | Status: DC | PRN

## 2015-04-10 MED ORDER — TETANUS-DIPHTH-ACELL PERTUSSIS 5-2.5-18.5 LF-MCG/0.5 IM SUSP: 0.5000 mL | Freq: Once | INTRAMUSCULAR | Status: DC

## 2015-04-10 MED ORDER — ZOLPIDEM TARTRATE 5 MG PO TABS: 5.0000 mg | ORAL_TABLET | Freq: Every evening | ORAL | Status: DC | PRN

## 2015-04-10 MED ORDER — ACETAMINOPHEN 325 MG PO TABS: 650.0000 mg | ORAL_TABLET | ORAL | Status: DC | PRN

## 2015-04-10 MED ORDER — WITCH HAZEL-GLYCERIN EX PADS
1.0000 "application " | MEDICATED_PAD | CUTANEOUS | Status: DC | PRN
  Administered 2015-04-10: 1 via TOPICAL

## 2015-04-10 MED ORDER — DIPHENHYDRAMINE HCL 25 MG PO CAPS: 25.0000 mg | ORAL_CAPSULE | Freq: Four times a day (QID) | ORAL | Status: DC | PRN

## 2015-04-10 MED ORDER — LANOLIN HYDROUS EX OINT: TOPICAL_OINTMENT | CUTANEOUS | Status: DC | PRN

## 2015-04-10 MED ORDER — ONDANSETRON HCL 4 MG/2ML IJ SOLN: 4.0000 mg | INTRAMUSCULAR | Status: DC | PRN

## 2015-04-10 MED ORDER — PRENATAL MULTIVITAMIN CH
1.0000 | ORAL_TABLET | Freq: Every day | ORAL | Status: DC
  Administered 2015-04-10: 1 via ORAL
  Filled 2015-04-10: qty 1

## 2015-04-10 MED ORDER — SENNOSIDES-DOCUSATE SODIUM 8.6-50 MG PO TABS
2.0000 | ORAL_TABLET | ORAL | Status: DC
  Administered 2015-04-10 (×2): 2 via ORAL
  Filled 2015-04-10 (×3): qty 2

## 2015-04-10 MED ORDER — LORATADINE 10 MG PO TABS
10.0000 mg | ORAL_TABLET | Freq: Every day | ORAL | Status: DC | PRN
  Filled 2015-04-10: qty 1

## 2015-04-10 MED ORDER — FERROUS SULFATE 325 (65 FE) MG PO TABS
325.0000 mg | ORAL_TABLET | Freq: Two times a day (BID) | ORAL | Status: DC
Start: 2015-04-10 — End: 2015-04-11
  Administered 2015-04-10 – 2015-04-11 (×2): 325 mg via ORAL
  Filled 2015-04-10 (×2): qty 1

## 2015-04-10 MED ORDER — OXYCODONE-ACETAMINOPHEN 5-325 MG PO TABS: 2.0000 | ORAL_TABLET | ORAL | Status: DC | PRN

## 2015-04-10 MED ORDER — IBUPROFEN 600 MG PO TABS
600.0000 mg | ORAL_TABLET | Freq: Four times a day (QID) | ORAL | Status: DC
  Administered 2015-04-10 – 2015-04-11 (×6): 600 mg via ORAL
  Filled 2015-04-10 (×8): qty 1

## 2015-04-10 MED ORDER — BENZOCAINE-MENTHOL 20-0.5 % EX AERO
1.0000 "application " | INHALATION_SPRAY | CUTANEOUS | Status: DC | PRN
  Administered 2015-04-10 – 2015-04-11 (×2): 1 via TOPICAL
  Filled 2015-04-10 (×3): qty 56

## 2015-04-10 NOTE — Discharge Summary (Signed)
Vaginal Delivery Discharge Summary  ALL information will be verified prior to discharge  Cynthia Henderson  DOB:    10-25-1981 MRN:    [id number] CSN:    [id number]  Date of admission:           04/09/15  Date of discharge:                   04/11/15  Procedures this admission: SVD  Date of Delivery: 04/10/15  Newborn Data:  Live born  Information for the patient's newborn:  Debruyne, Boy Keishawn [[id number]]  female   Live born female  Birth Weight: 6 lb 15.8 oz (3170 g) APGAR: 8, 9  Home with mother. Name Cynthia Henderson Circumcision Plan: in patient   History of Present Illness: [name] is a 34 y.o. female, [phone], who presents at [redacted]w[redacted]d weeks gestation. The patient has been followed at the [facility] division of [facility]. She was admitted onset of labor. Her pregnancy has been complicated by:  Patient Active Problem List   Diagnosis Date Noted  . NSVD (normal spontaneous vaginal delivery) 04/10/2015  . Incomplete spontaneous abortion - D&C 04/29/14 04/09/2015  . Hemorrhoids during pregnancy 04/09/2015  . History of anemia 04/09/2015  . Seasonal allergies 04/09/2015  . Penicillin allergy 04/15/2012    Hospital course: The patient was admitted for labor.   Her labor was not complicated. She proceeded to have a vaginal delivery of a healthy infant. Her delivery was not complicated. Her postpartum course was not complicated. She was discharged to home on postpartum day 2 doing well.  Feeding: breast  Contraception: vasectomy  Discharge hemoglobin: HEMOGLOBIN  Date Value Ref Range Status  04/10/2015 10.6* 12.0 - 15.0 g/dL Final   HCT  Date Value Ref Range Status  04/10/2015 31.3* 36.0 - 46.0 % Final    PreNatal Labs ABO, Rh: --/--/O POS (04/10 1919)   Antibody: NEG (04/10 1919) Rubella:    immune RPR: Non Reactive (04/10 1919)  HBsAg: Negative (03/05 0000)  HIV: Non-reactive (03/05 0000)  GBS: Negative (03/21  0000)  Discharge Physical Exam:  General: alert and cooperative Lochia: appropriate Uterine Fundus: firm Incision: healing well DVT Evaluation: No evidence of DVT seen on physical exam.  Intrapartum Procedures: spontaneous vaginal delivery Postpartum Procedures: none Complications-Operative and Postpartum: left labial  Discharge Diagnoses: Term Pregnancy-delivered,  asymptomatic anemia  Activity:           pelvic rest Diet:                routine Medications: PNV, Ibuprofen, Iron and Percocet Condition:      stable     Postpartum Teaching: Nutrition, exercise, return to work or school, family visits, sexual activity, home rest, vaginal bleeding, pelvic rest, family planning, s/s of PPD, breast care peri-care and incision care   Discharge to: home  Follow-up Information    Follow up with [facility]. Schedule an appointment as soon as possible for a visit in 6 weeks.   Specialty:  Obstetrics and Gynecology   Why:  Postpartum check up, Call with any questions or concerns   Contact information:   [address] [place] [address] [phone]       Demaris Leavell, CNM, MSN 04/10/2015. 1:05 PM  All information will be verified prior to discharge   Postpartum Care After Vaginal Delivery  After you deliver your newborn (postpartum period), the usual stay in the hospital is  24 72 hours. If there were problems with your labor or delivery, or if you have other medical problems, you might be in the hospital longer.  While you are in the hospital, you will receive help and instructions on how to care for yourself and your newborn during the postpartum period.  While you are in the hospital:  Be sure to tell your nurses if you have pain or discomfort, as well as where you feel the pain and what makes the pain worse.  If you had an incision made near your vagina (episiotomy) or if you had some tearing during  delivery, the nurses may put ice packs on your episiotomy or tear. The ice packs may help to reduce the pain and swelling.  If you are breastfeeding, you may feel uncomfortable contractions of your uterus for a couple of weeks. This is normal. The contractions help your uterus get back to normal size.  It is normal to have some bleeding after delivery.  For the first 1 3 days after delivery, the flow is red and the amount may be similar to a period.  It is common for the flow to start and stop.  In the first few days, you may pass some small clots. Let your nurses know if you begin to pass large clots or your flow increases.  Do not  flush blood clots down the toilet before having the nurse look at them.  During the next 3 10 days after delivery, your flow should become more watery and pink or brown-tinged in color.  Ten to fourteen days after delivery, your flow should be a small amount of yellowish-white discharge.  The amount of your flow will decrease over the first few weeks after delivery. Your flow may stop in 6 8 weeks. Most women have had their flow stop by 12 weeks after delivery.  You should change your sanitary pads frequently.  Wash your hands thoroughly with soap and water for at least 20 seconds after changing pads, using the toilet, or before holding or feeding your newborn.  You should feel like you need to empty your bladder within the first 6 8 hours after delivery.  In case you become weak, lightheaded, or faint, call your nurse before you get out of bed for the first time and before you take a shower for the first time.  Within the first few days after delivery, your breasts may begin to feel tender and full. This is called engorgement. Breast tenderness usually goes away within 48 72 hours after engorgement occurs. You may also notice milk leaking from your breasts. If you are not breastfeeding, do not stimulate your breasts. Breast stimulation can make your breasts  produce more milk.  Spending as much time as possible with your newborn is very important. During this time, you and your newborn can feel close and get to know each other. Having your newborn stay in your room (rooming in) will help to strengthen the bond with your newborn. It will give you time to get to know your newborn and become comfortable caring for your newborn.  Your hormones change after delivery. Sometimes the hormone changes can temporarily cause you to feel sad or tearful. These feelings should not last more than a few days. If these feelings last longer than that, you should talk to your caregiver.  If desired, talk to your caregiver about methods of family planning or contraception.  Talk to your caregiver about immunizations. Your caregiver may want you  to have the following immunizations before leaving the hospital:  Tetanus, diphtheria, and pertussis (Tdap) or tetanus and diphtheria (Td) immunization. It is very important that you and your family (including grandparents) or others caring for your newborn are up-to-date with the Tdap or Td immunizations. The Tdap or Td immunization can help protect your newborn from getting ill.  Rubella immunization.  Varicella (chickenpox) immunization.  Influenza immunization. You should receive this annual immunization if you did not receive the immunization during your pregnancy. Document Released: 10/13/2007 Document Revised: 09/09/2012 Document Reviewed: 08/12/2012 [facility] Patient Information 2014 [place], [place].   Postpartum Depression and Baby Blues  The postpartum period begins right after the birth of a baby. During this time, there is often a great amount of joy and excitement. It is also a time of considerable changes in the life of the parent(s). Regardless of how many times a mother gives birth, each child brings new challenges and dynamics to the family. It is not unusual to have feelings of excitement accompanied by  confusing shifts in moods, emotions, and thoughts. All mothers are at risk of developing postpartum depression or the "baby blues." These mood changes can occur right after giving birth, or they may occur many months after giving birth. The baby blues or postpartum depression can be mild or severe. Additionally, postpartum depression can resolve rather quickly, or it can be a long-term condition. CAUSES Elevated hormones and their rapid decline are thought to be a main cause of postpartum depression and the baby blues. There are a number of hormones that radically change during and after pregnancy. Estrogen and progesterone usually decrease immediately after delivering your baby. The level of thyroid hormone and various cortisol steroids also rapidly drop. Other factors that play a major role in these changes include major life events and genetics.  RISK FACTORS If you have any of the following risks for the baby blues or postpartum depression, know what symptoms to watch out for during the postpartum period. Risk factors that may increase the likelihood of getting the baby blues or postpartum depression include: 1. Havinga personal or family history of depression. 2. Having depression while being pregnant. 3. Having premenstrual or oral contraceptive-associated mood issues. 4. Having exceptional life stress. 5. Having marital conflict. 6. Lacking a social support network. 7. Having a baby with special needs. 8. Having health problems such as diabetes. SYMPTOMS Baby blues symptoms include:  Brief fluctuations in mood, such as going from extreme happiness to sadness.  Decreased concentration.  Difficulty sleeping.  Crying spells, tearfulness.  Irritability.  Anxiety. Postpartum depression symptoms typically begin within the first month after giving birth. These symptoms include:  Difficulty sleeping or excessive sleepiness.  Marked weight loss.  Agitation.  Feelings of  worthlessness.  Lack of interest in activity or food. Postpartum psychosis is a very concerning condition and can be dangerous. Fortunately, it is rare. Displaying any of the following symptoms is cause for immediate medical attention. Postpartum psychosis symptoms include:  Hallucinations and delusions.  Bizarre or disorganized behavior.  Confusion or disorientation. DIAGNOSIS  A diagnosis is made by an evaluation of your symptoms. There are no medical or lab tests that lead to a diagnosis, but there are various questionnaires that a caregiver may use to identify those with the baby blues, postpartum depression, or psychosis. Often times, a screening tool called the [place] Postnatal Depression Scale is used to diagnose depression in the postpartum period.  TREATMENT The baby blues usually goes away on its own  in 1 to 2 weeks. Social support is often all that is needed. You should be encouraged to get adequate sleep and rest. Occasionally, you may be given medicines to help you sleep.  Postpartum depression requires treatment as it can last several months or longer if it is not treated. Treatment may include individual or group therapy, medicine, or both to address any social, physiological, and psychological factors that may play a role in the depression. Regular exercise, a healthy diet, rest, and social support may also be strongly recommended.  Postpartum psychosis is more serious and needs treatment right away. Hospitalization is often needed. HOME CARE INSTRUCTIONS  Get as much rest as you can. Nap when the baby sleeps.  Exercise regularly. Some women find yoga and walking to be beneficial.  Eat a balanced and nourishing diet.  Do little things that you enjoy. Have a cup of tea, take a bubble bath, read your favorite magazine, or listen to your favorite music.  Avoid alcohol.  Ask for help with household chores, cooking, grocery shopping, or running errands as needed. Do not try  to do everything.  Talk to people close to you about how you are feeling. Get support from your partner, family members, friends, or other new moms.  Try to stay positive in how you think. Think about the things you are grateful for.  Do not spend a lot of time alone.  Only take medicine as directed by your caregiver.  Keep all your postpartum appointments.  Let your caregiver know if you have any concerns. SEEK MEDICAL CARE IF: You are having a reaction or problems with your medicine. SEEK IMMEDIATE MEDICAL CARE IF:  You have suicidal feelings.  You feel you may harm the baby or someone else. Document Released: 09/19/2004 Document Revised: 03/09/2012 Document Reviewed: 10/22/2011 [facility] Patient Information 2014 [place], [place].     Breastfeeding Deciding to breastfeed is one of the best choices you can make for you and your baby. A change in hormones during pregnancy causes your breast tissue to grow and increases the number and size of your milk ducts. These hormones also allow proteins, sugars, and fats from your blood supply to make breast milk in your milk-producing glands. Hormones prevent breast milk from being released before your baby is born as well as prompt milk flow after birth. Once breastfeeding has begun, thoughts of your baby, as well as his or her sucking or crying, can stimulate the release of milk from your milk-producing glands.  BENEFITS OF BREASTFEEDING For Your Baby  Your first milk (colostrum) helps your baby's digestive system function better.   There are antibodies in your milk that help your baby fight off infections.   Your baby has a lower incidence of asthma, allergies, and sudden infant death syndrome.   The nutrients in breast milk are better for your baby than infant formulas and are designed uniquely for your baby's needs.   Breast milk improves your baby's brain development.   Your baby is less likely to develop other conditions,  such as childhood obesity, asthma, or type 2 diabetes mellitus.  For You   Breastfeeding helps to create a very special bond between you and your baby.   Breastfeeding is convenient. Breast milk is always available at the correct temperature and costs nothing.   Breastfeeding helps to burn calories and helps you lose the weight gained during pregnancy.   Breastfeeding makes your uterus contract to its prepregnancy size faster and slows bleeding (lochia) after  you give birth.   Breastfeeding helps to lower your risk of developing type 2 diabetes mellitus, osteoporosis, and breast or ovarian cancer later in life. SIGNS THAT YOUR BABY IS HUNGRY Early Signs of Hunger  Increased alertness or activity.  Stretching.  Movement of the head from side to side.  Movement of the head and opening of the mouth when the corner of the mouth or cheek is stroked (rooting).  Increased sucking sounds, smacking lips, cooing, sighing, or squeaking.  Hand-to-mouth movements.  Increased sucking of fingers or hands. Late Signs of Hunger  Fussing.  Intermittent crying. Extreme Signs of Hunger Signs of extreme hunger will require calming and consoling before your baby will be able to breastfeed successfully. Do not wait for the following signs of extreme hunger to occur before you initiate breastfeeding:   Restlessness.  A loud, strong cry.   Screaming.   BREASTFEEDING BASICS Breastfeeding Initiation  Find a comfortable place to sit or lie down, with your neck and back well supported.  Place a pillow or rolled up blanket under your baby to bring him or her to the level of your breast (if you are seated). Nursing pillows are specially designed to help support your arms and your baby while you breastfeed.  Make sure that your baby's abdomen is facing your abdomen.   Gently massage your breast. With your fingertips, massage from your chest wall toward your nipple in a circular motion.  This encourages milk flow. You may need to continue this action during the feeding if your milk flows slowly.  Support your breast with 4 fingers underneath and your thumb above your nipple. Make sure your fingers are well away from your nipple and your baby's mouth.   Stroke your baby's lips gently with your finger or nipple.   When your baby's mouth is open wide enough, quickly bring your baby to your breast, placing your entire nipple and as much of the colored area around your nipple (areola) as possible into your baby's mouth.   More areola should be visible above your baby's upper lip than below the lower lip.   Your baby's tongue should be between his or her lower gum and your breast.   Ensure that your baby's mouth is correctly positioned around your nipple (latched). Your baby's lips should create a seal on your breast and be turned out (everted).  It is common for your baby to suck about 2-3 minutes in order to start the flow of breast milk. Latching Teaching your baby how to latch on to your breast properly is very important. An improper latch can cause nipple pain and decreased milk supply for you and poor weight gain in your baby. Also, if your baby is not latched onto your nipple properly, he or she may swallow some air during feeding. This can make your baby fussy. Burping your baby when you switch breasts during the feeding can help to get rid of the air. However, teaching your baby to latch on properly is still the best way to prevent fussiness from swallowing air while breastfeeding. Signs that your baby has successfully latched on to your nipple:    Silent tugging or silent sucking, without causing you pain.   Swallowing heard between every 3-4 sucks.    Muscle movement above and in front of his or her ears while sucking.  Signs that your baby has not successfully latched on to nipple:   Sucking sounds or smacking sounds from your baby while  breastfeeding.  Nipple pain. If you think your baby has not latched on correctly, slip your finger into the corner of your baby's mouth to break the suction and place it between your baby's gums. Attempt breastfeeding initiation again. Signs of Successful Breastfeeding Signs from your baby:   A gradual decrease in the number of sucks or complete cessation of sucking.   Falling asleep.   Relaxation of his or her body.   Retention of a small amount of milk in his or her mouth.   Letting go of your breast by himself or herself. Signs from you:  Breasts that have increased in firmness, weight, and size 1-3 hours after feeding.   Breasts that are softer immediately after breastfeeding.  Increased milk volume, as well as a change in milk consistency and color by the fifth day of breastfeeding.   Nipples that are not sore, cracked, or bleeding. Signs That Your Pecola Leisure is Getting Enough Milk  Wetting at least 3 diapers in a 24-hour period. The urine should be clear and pale yellow by age 28 days.  At least 3 stools in a 24-hour period by age 28 days. The stool should be soft and yellow.  At least 3 stools in a 24-hour period by age 837 days. The stool should be seedy and yellow.  No loss of weight greater than 10% of birth weight during the first 50 days of age.  Average weight gain of 4-7 ounces (113-198 g) per week after age 83 days.  Consistent daily weight gain by age 28 days, without weight loss after the age of 2 weeks. After a feeding, your baby may spit up a small amount. This is common. BREASTFEEDING FREQUENCY AND DURATION Frequent feeding will help you make more milk and can prevent sore nipples and breast engorgement. Breastfeed when you feel the need to reduce the fullness of your breasts or when your baby shows signs of hunger. This is called "breastfeeding on demand." Avoid introducing a pacifier to your baby while you are working to establish breastfeeding (the first 4-6  weeks after your baby is born). After this time you may choose to use a pacifier. Research has shown that pacifier use during the first year of a baby's life decreases the risk of sudden infant death syndrome (SIDS). Allow your baby to feed on each breast as long as he or she wants. Breastfeed until your baby is finished feeding. When your baby unlatches or falls asleep while feeding from the first breast, offer the second breast. Because newborns are often sleepy in the first few weeks of life, you may need to awaken your baby to get him or her to feed. Breastfeeding times will vary from baby to baby. However, the following rules can serve as a guide to help you ensure that your baby is properly fed:  Newborns (babies 78 weeks of age or younger) may breastfeed every 1-3 hours.  Newborns should not go longer than 3 hours during the day or 5 hours during the night without breastfeeding.  You should breastfeed your baby a minimum of 8 times in a 24-hour period until you begin to introduce solid foods to your baby at around 71 months of age. BREAST MILK PUMPING Pumping and storing breast milk allows you to ensure that your baby is exclusively fed your breast milk, even at times when you are unable to breastfeed. This is especially important if you are going back to work while you are still breastfeeding or when you  are not able to be present during feedings. Your lactation consultant can give you guidelines on how long it is safe to store breast milk.  A breast pump is a machine that allows you to pump milk from your breast into a sterile bottle. The pumped breast milk can then be stored in a refrigerator or freezer. Some breast pumps are operated by hand, while others use electricity. Ask your lactation consultant which type will work best for you. Breast pumps can be purchased, but some hospitals and breastfeeding support groups lease breast pumps on a monthly basis. A lactation consultant can teach you how  to hand express breast milk, if you prefer not to use a pump.  CARING FOR YOUR BREASTS WHILE YOU BREASTFEED Nipples can become dry, cracked, and sore while breastfeeding. The following recommendations can help keep your breasts moisturized and healthy:  Avoid using soap on your nipples.   Wear a supportive bra. Although not required, special nursing bras and tank tops are designed to allow access to your breasts for breastfeeding without taking off your entire bra or top. Avoid wearing underwire-style bras or extremely tight bras.  Air dry your nipples for 3-734minutes after each feeding.   Use only cotton bra pads to absorb leaked breast milk. Leaking of breast milk between feedings is normal.   Use lanolin on your nipples after breastfeeding. Lanolin helps to maintain your skin's normal moisture barrier. If you use pure lanolin, you do not need to wash it off before feeding your baby again. Pure lanolin is not toxic to your baby. You may also hand express a few drops of breast milk and gently massage that milk into your nipples and allow the milk to air dry. In the first few weeks after giving birth, some women experience extremely full breasts (engorgement). Engorgement can make your breasts feel heavy, warm, and tender to the touch. Engorgement peaks within 3-5 days after you give birth. The following recommendations can help ease engorgement:  Completely empty your breasts while breastfeeding or pumping. You may want to start by applying warm, moist heat (in the shower or with warm water-soaked hand towels) just before feeding or pumping. This increases circulation and helps the milk flow. If your baby does not completely empty your breasts while breastfeeding, pump any extra milk after he or she is finished.  Wear a snug bra (nursing or regular) or tank top for 1-2 days to signal your body to slightly decrease milk production.  Apply ice packs to your breasts, unless this is too  uncomfortable for you.  Make sure that your baby is latched on and positioned properly while breastfeeding. If engorgement persists after 48 hours of following these recommendations, contact your health care provider or a Advertising copywriterlactation consultant. OVERALL HEALTH CARE RECOMMENDATIONS WHILE BREASTFEEDING  Eat healthy foods. Alternate between meals and snacks, eating 3 of each per day. Because what you eat affects your breast milk, some of the foods may make your baby more irritable than usual. Avoid eating these foods if you are sure that they are negatively affecting your baby.  Drink milk, fruit juice, and water to satisfy your thirst (about 10 glasses a day).   Rest often, relax, and continue to take your prenatal vitamins to prevent fatigue, stress, and anemia.  Continue breast self-awareness checks.  Avoid chewing and smoking tobacco.  Avoid alcohol and drug use. Some medicines that may be harmful to your baby can pass through breast milk. It is important to ask your  health care provider before taking any medicine, including all over-the-counter and prescription medicine as well as vitamin and herbal supplements. It is possible to become pregnant while breastfeeding. If birth control is desired, ask your health care provider about options that will be safe for your baby. SEEK MEDICAL CARE IF:   You feel like you want to stop breastfeeding or have become frustrated with breastfeeding.  You have painful breasts or nipples.  Your nipples are cracked or bleeding.  Your breasts are red, tender, or warm.  You have a swollen area on either breast.  You have a fever or chills.  You have nausea or vomiting.  You have drainage other than breast milk from your nipples.  Your breasts do not become full before feedings by the fifth day after you give birth.  You feel sad and depressed.  Your baby is too sleepy to eat well.  Your baby is having trouble sleeping.   Your baby is wetting  less than 3 diapers in a 24-hour period.  Your baby has less than 3 stools in a 24-hour period.  Your baby's skin or the white part of his or her eyes becomes yellow.   Your baby is not gaining weight by 53 days of age. SEEK IMMEDIATE MEDICAL CARE IF:   Your baby is overly tired (lethargic) and does not want to wake up and feed.  Your baby develops an unexplained fever. Document Released: 12/16/2005 Document Revised: 12/21/2013 Document Reviewed: 06/09/2013 [facility] Patient Information 2015 [place], [place]. This information is not intended to replace advice given to you by your health care provider. Make sure you discuss any questions you have with your health care provider.

## 2015-04-10 NOTE — Progress Notes (Signed)
To [facility]   Report given to Vonna KotykSue Owens

## 2015-04-10 NOTE — Anesthesia Postprocedure Evaluation (Signed)
Anesthesia Post Note  Patient: Cynthia Henderson  Procedure(s) Performed: * No procedures listed *  Anesthesia type: Epidural  Patient location: Mother/Baby  Post pain: Pain level controlled  Post assessment: Post-op Vital signs reviewed  Last Vitals:  Filed Vitals:   04/10/15 0900  BP: 117/68  Pulse: 88  Temp: 36.6 C  Resp: 17    Post vital signs: Reviewed  Level of consciousness:alert  Complications: No apparent anesthesia complications

## 2015-04-10 NOTE — Progress Notes (Signed)
Cynthia Henderson   Subjective: Post Partum Day 0 Vaginal delivery, left labial Patient up ad lib, denies syncope or dizziness. Reports consuming regular diet without issues and denies N/V No issues with urination and reports bleeding is appropriate  Feeding:  breast Contraceptive plan:   In patient  Objective: Temp:  [97.9 F (36.6 C)-98.7 F (37.1 C)] 97.9 F (36.6 C) (04/11 0900) Pulse Rate:  [80-163] 88 (04/11 0900) Resp:  [16-20] 17 (04/11 0900) BP: (105-142)/(66-105) 117/68 mmHg (04/11 0900) SpO2:  [97 %-98 %] 98 % (04/10 2156) Weight:  [139 lb (63.05 kg)] 139 lb (63.05 kg) (04/10 2115)  Physical Exam:  General: alert and cooperative Ext: WNL, no edema. No evidence of DVT seen on physical exam. Breast: Soft filling Lungs: CTAB Heart RRR without murmur  Abdomen:  Soft, fundus firm, lochia scant, + bowel sounds, non distended, non tender Lochia: appropriate Uterine Fundus: firm Laceration: healing well    Recent Labs  04/09/15 1919 04/10/15 0555  HGB 12.5 10.6*  HCT 36.6 31.3*    Assessment S/P Vaginal Delivery-Day 0 Stable  Normal Involution Breastfeedin Circumcision: in patient  Plan: Continue current care Discharge home and Lactation consult Lactation support   Mishael Krysiak, CNM, MSN 04/10/2015, 12:42 PM

## 2015-04-10 NOTE — Lactation Note (Signed)
This note was copied from the chart of Cynthia Henderson. Lactation Consultation Note  Patient Name: Cynthia Henderson ZOXWR'UToday's Date: 04/10/2015 Reason for consult: Initial assessment Mom reports she feels this baby is latching well. Baby on breast at this visit, LC notes good suckling bursts with some swallowing motions noted. Basic teaching review. Encouraged to BF with feeding ques, STS. Lactation brochure left for review, advised of OP services and support group. Mom denies other questions/concerns. Encouraged to call if she would like assist.   Maternal Data Has patient been taught Hand Expression?: Yes  Feeding Feeding Type: Breast Fed Length of feed: 5 min  LATCH Score/Interventions Latch: Grasps breast easily, tongue down, lips flanged, rhythmical sucking.  Audible Swallowing: A few with stimulation Intervention(s): Skin to skin  Type of Nipple: Everted at rest and after stimulation  Comfort (Breast/Nipple): Soft / non-tender     Hold (Positioning): No assistance needed to correctly position infant at breast.  LATCH Score: 9  Lactation Tools Discussed/Used WIC Program: No   Consult Status Consult Status: Follow-up Date: 04/11/15 Follow-up type: In-patient    Alfred LevinsGranger, Garon Melander Ann 04/10/2015, 1:39 PM

## 2015-04-11 MED ORDER — FERROUS SULFATE 325 (65 FE) MG PO TABS: 325.0000 mg | ORAL_TABLET | Freq: Two times a day (BID) | ORAL | Status: DC

## 2015-04-11 MED ORDER — IBUPROFEN 600 MG PO TABS: 600.0000 mg | ORAL_TABLET | Freq: Four times a day (QID) | ORAL | Status: DC

## 2015-04-11 MED ORDER — OXYCODONE-ACETAMINOPHEN 5-325 MG PO TABS: 1.0000 | ORAL_TABLET | ORAL | Status: DC | PRN

## 2016-02-14 IMAGING — US US FETAL BPP W/O NONSTRESS
1 series · 13 of 28 positions shown · non-contrast
Comparison: none

[Series 1: us fetal bpp w/o nonstress · 0.21mm/px · 35 acquisitions, 13 frames shown]
[im 2/35]
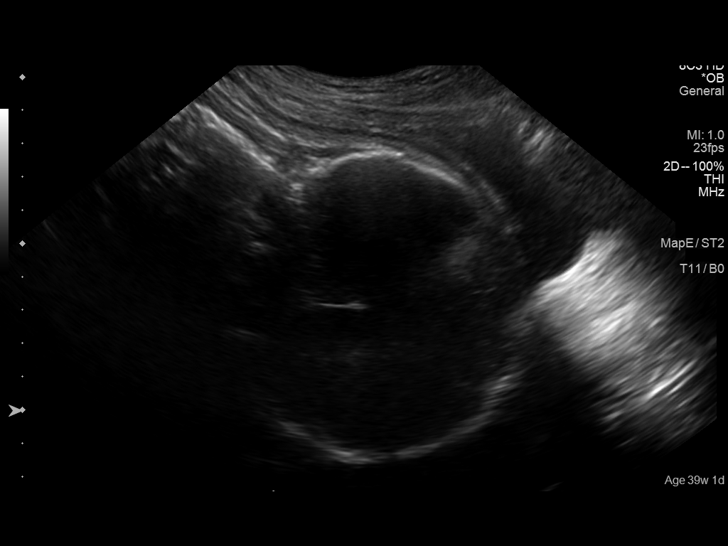
[im 4/35]
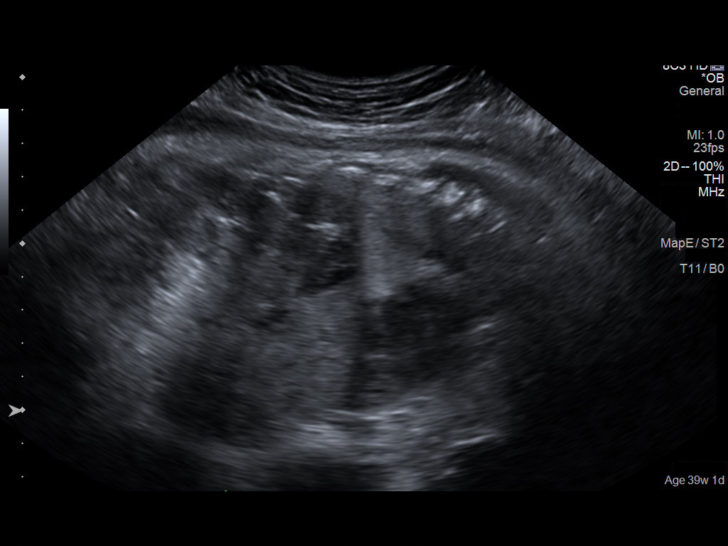
[im 7/35]
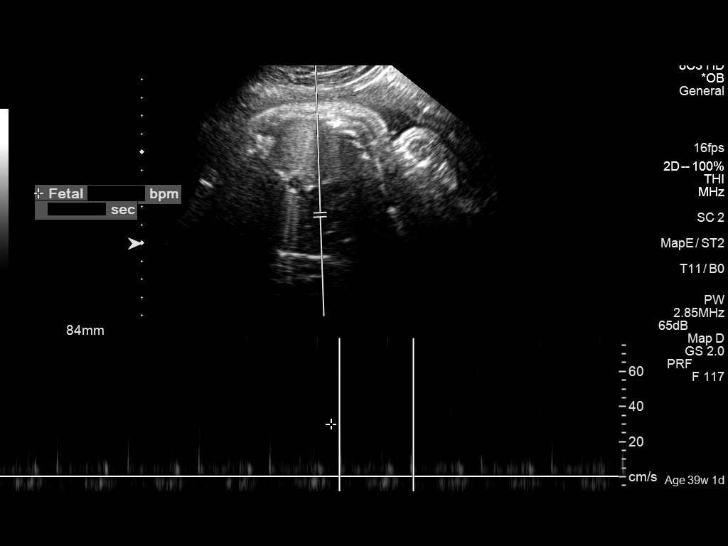
[im 9/35]
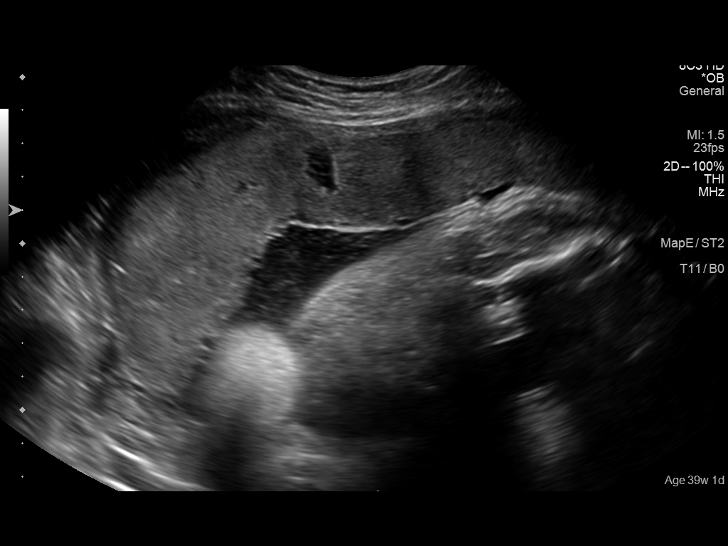
[im 12/35]
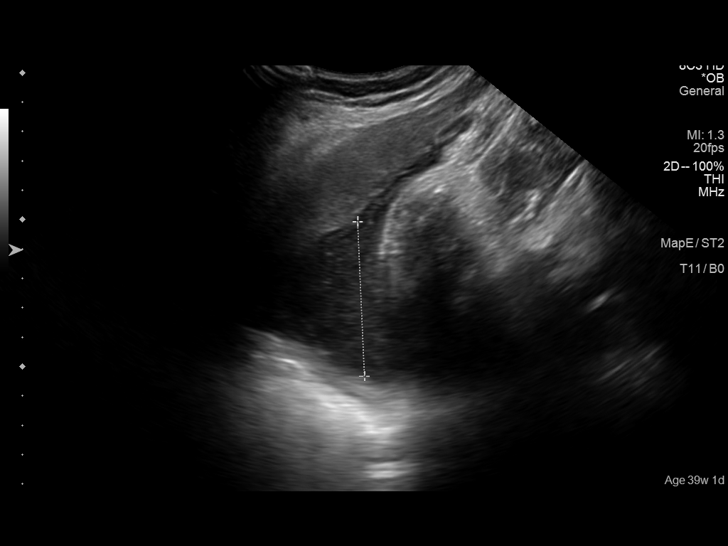
[im 14/35]
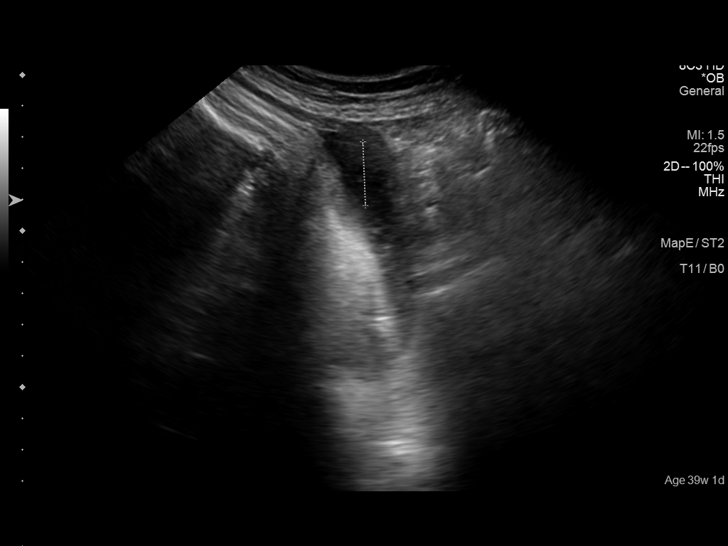
[im 18/35]
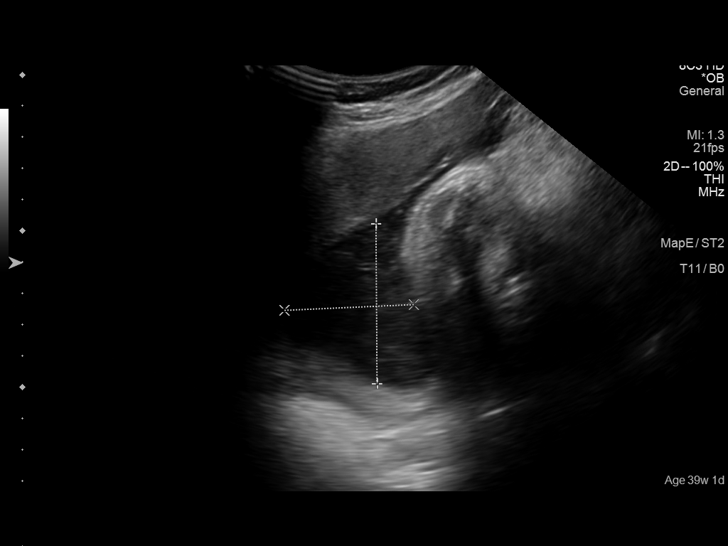
[im 21/35]
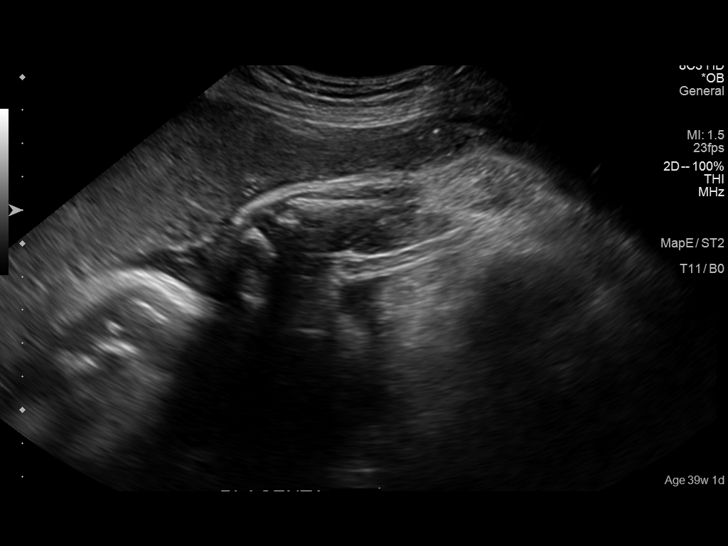
[im 23/35]
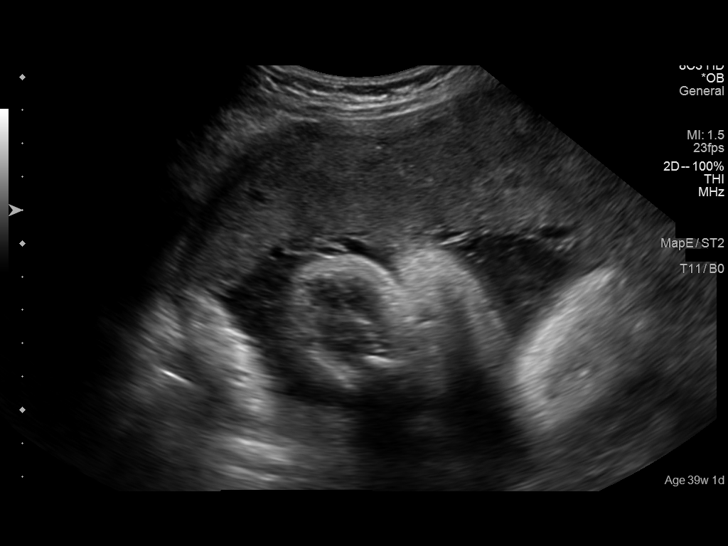
[im 26/35]
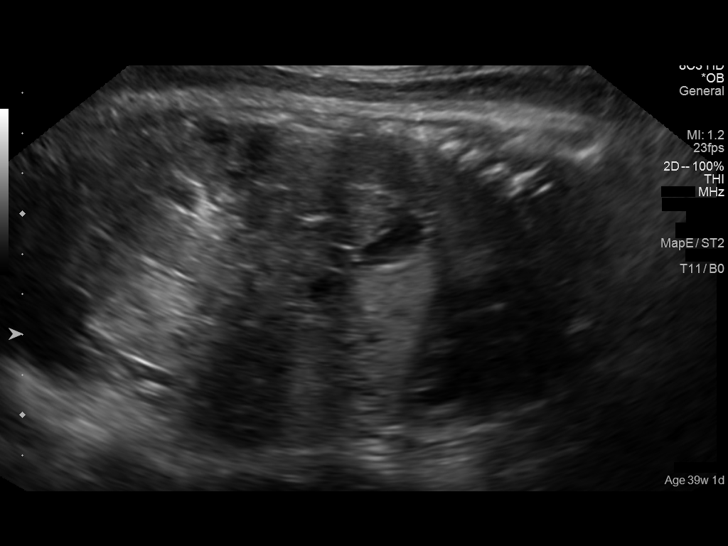
[im 28/35]
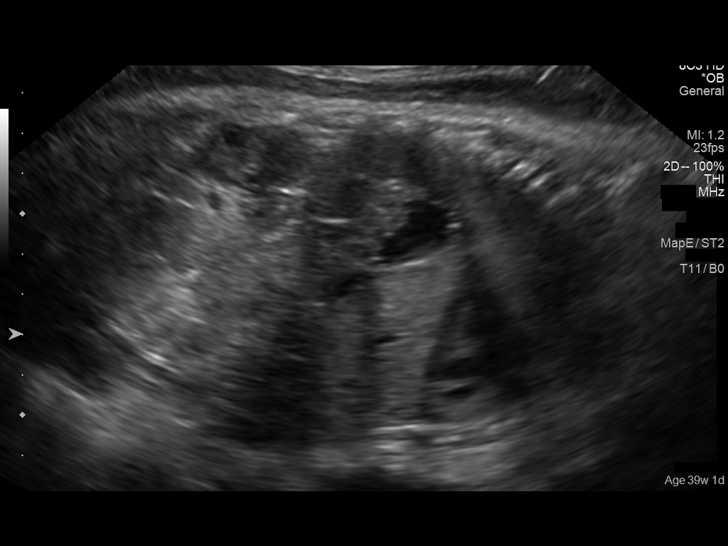
[im 31/35]
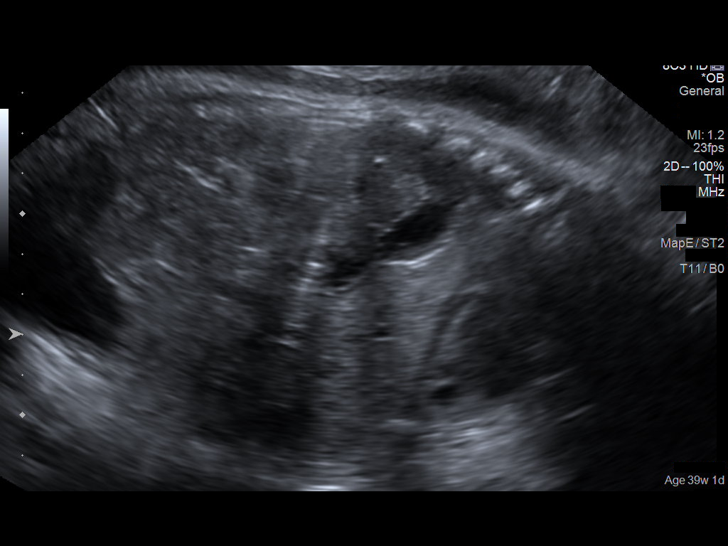
[im 33/35]
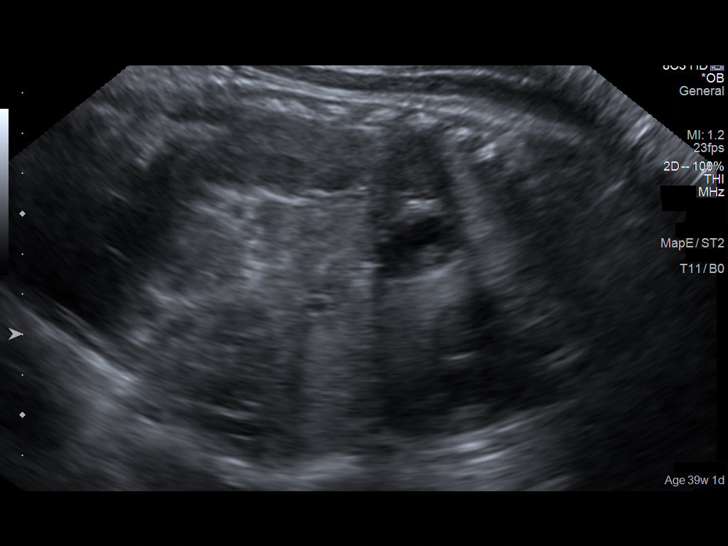

[13 of 28 positions shown; findings below may reference images not displayed]

OBSTETRICS REPORT
(Signed Final 04/09/2015 [DATE])

Service(s) Provided

Indications

Decreased fetal movements, third trimester,
unspecified (Cat 1 FHR tracing)
39 weeks gestation of pregnancy
Fetal Evaluation

Num Of Fetuses:    1
Fetal Heart Rate:  139                          bpm
Cardiac Activity:  Observed
Presentation:      Cephalic
Placenta:          Anterior, above cervical os
P. Cord            Not well visualized
Insertion:

Amniotic Fluid
AFI FV:      Subjectively within normal limits
AFI Sum:     11       cm      36  %Tile      Larg Pckt:    5.3  cm
RUQ:   5.3     cm   RLQ:    1.8    cm    LUQ:    2      cm    LLQ:   1.9     cm
Biophysical Evaluation

Amniotic F.V:   Pocket => 2 cm two         F. Tone:         Observed
planes
F. Movement:    Observed                   Score:           [DATE]
F. Breathing:   Not Observed
Gestational Age

LMP:           39w 1d        Date:  07/09/14                 EDD:    04/15/15
Best:          39w 1d     Det. By:  LMP  (07/09/14)          EDD:    04/15/15
Impression

Single living intrauterine pregnancy at 39 weeks 1 day.
Normal amniotic fluid volume.
BPP [DATE] (-2 for lack of fetal  breathing).
Recommendations

Follow-up ultrasounds as clinically indicated.
questions or concerns.

## 2017-07-15 ENCOUNTER — Other Ambulatory Visit: Payer: Self-pay | Admitting: Family Medicine

## 2017-07-15 DIAGNOSIS — R945 Abnormal results of liver function studies: Principal | ICD-10-CM

## 2017-07-15 DIAGNOSIS — R7989 Other specified abnormal findings of blood chemistry: Secondary | ICD-10-CM

## 2017-07-17 ENCOUNTER — Other Ambulatory Visit: Payer: Self-pay

## 2017-08-30 ENCOUNTER — Emergency Department (HOSPITAL_COMMUNITY): Payer: BC Managed Care – PPO

## 2017-08-30 ENCOUNTER — Emergency Department (HOSPITAL_COMMUNITY)
Admission: EM | Admit: 2017-08-30 | Discharge: 2017-08-30 | Disposition: A | Discharge status: Home / Self Care | Payer: BC Managed Care – PPO | Attending: Emergency Medicine | Admitting: Emergency Medicine

## 2017-08-30 ENCOUNTER — Encounter (HOSPITAL_COMMUNITY): Payer: Self-pay | Admitting: Emergency Medicine

## 2017-08-30 DIAGNOSIS — R002 Palpitations: Secondary | ICD-10-CM

## 2017-08-30 DIAGNOSIS — R0602 Shortness of breath: Secondary | ICD-10-CM

## 2017-08-30 DIAGNOSIS — Z79899 Other long term (current) drug therapy: Secondary | ICD-10-CM | POA: Diagnosis not present

## 2017-08-30 LAB — CBC WITH DIFFERENTIAL/PLATELET
Basophils Absolute: 0 10*3/uL (ref 0.0–0.1)
Basophils Relative: 0 %
EOS ABS: 0 10*3/uL (ref 0.0–0.7)
Eosinophils Relative: 0 %
HCT: 39.8 % (ref 36.0–46.0)
Hemoglobin: 13.6 g/dL (ref 12.0–15.0)
LYMPHS PCT: 17 %
Lymphs Abs: 1.3 10*3/uL (ref 0.7–4.0)
MCH: 29.7 pg (ref 26.0–34.0)
MCHC: 34.2 g/dL (ref 30.0–36.0)
MCV: 86.9 fL (ref 78.0–100.0)
MONO ABS: 0.4 10*3/uL (ref 0.1–1.0)
Monocytes Relative: 5 %
Neutro Abs: 6.3 10*3/uL (ref 1.7–7.7)
Neutrophils Relative %: 78 %
Platelets: 251 10*3/uL (ref 150–400)
RBC: 4.58 MIL/uL (ref 3.87–5.11)
RDW: 12.6 % (ref 11.5–15.5)
WBC: 8 10*3/uL (ref 4.0–10.5)

## 2017-08-30 LAB — COMPREHENSIVE METABOLIC PANEL
ALT: 15 U/L (ref 14–54)
ANION GAP: 9 (ref 5–15)
AST: 29 U/L (ref 15–41)
Albumin: 4.5 g/dL (ref 3.5–5.0)
Alkaline Phosphatase: 61 U/L (ref 38–126)
BUN: 8 mg/dL (ref 6–20)
CHLORIDE: 106 mmol/L (ref 101–111)
CO2: 24 mmol/L (ref 22–32)
Calcium: 9.8 mg/dL (ref 8.9–10.3)
Creatinine, Ser: 0.7 mg/dL (ref 0.44–1.00)
Glucose, Bld: 93 mg/dL (ref 65–99)
POTASSIUM: 3.7 mmol/L (ref 3.5–5.1)
SODIUM: 139 mmol/L (ref 135–145)
Total Bilirubin: 0.8 mg/dL (ref 0.3–1.2)
Total Protein: 7.7 g/dL (ref 6.5–8.1)

## 2017-08-30 LAB — I-STAT BETA HCG BLOOD, ED (MC, WL, AP ONLY)

## 2017-08-30 LAB — TSH: TSH: 0.455 u[IU]/mL (ref 0.350–4.500)

## 2017-08-30 NOTE — ED Provider Notes (Signed)
[facility]PT Provider Note   CSN: [id number] Arrival date & time: 08/30/17  1202     History   Chief Complaint Chief Complaint  Patient presents with  . Anxiety    HPI Georges MouseSarah Boyd is a 36 y.o. female.  36 year old female with history of Lyme disease and treated with a month of antibiotics presents complaining of intermittent episodes of dizziness and palpitations. Denies any syncope or near-syncope with this. Has had loss of stress in her life recently. Does have periods of hyperventilation. Notes that she's had no depression or suicidal ideations. No recent history of blood loss. No pleuritic pain or chest. No recent medications being used. Denies any rashes. Symptoms resolve on their own and lasts for just a few minutes.      Past Medical History:  Diagnosis Date  . Anemia    post D @ [facility]  . Headache(784.0)    migraine  . Prolonged rupture of membranes 07/20/2012    Patient Active Problem List   Diagnosis Date Noted  . NSVD (normal spontaneous vaginal delivery) 04/10/2015  . Incomplete spontaneous abortion - D&C 04/29/14 04/09/2015  . Hemorrhoids during pregnancy 04/09/2015  . History of anemia 04/09/2015  . Seasonal allergies 04/09/2015  . Penicillin allergy 04/15/2012    Past Surgical History:  Procedure Laterality Date  . DILATION AND CURETTAGE OF UTERUS  2012  . DILATION AND CURETTAGE OF UTERUS    . DILATION AND EVACUATION N/A 04/15/2014   Procedure: DILATATION AND EVACUATION;  Surgeon: Purcell NailsAngela Y Roberts, MD;  Location: [facility];  Service: Gynecology;  Laterality: N/A;  . WISDOM TOOTH EXTRACTION  2007    OB History    Gravida Para Term Preterm AB Living   5 2 2   2 2    SAB TAB Ectopic Multiple Live Births   2     0 2       Home Medications    Prior to Admission medications   Medication Sig Start Date End Date Taking? Authorizing Provider  ferrous sulfate 325 (65 FE) MG tablet Take 1 tablet (325 mg total) by mouth 2 (two) times daily with a meal.  04/11/15   Standard, Venus, CNM  ibuprofen (ADVIL,MOTRIN) 600 MG tablet Take 1 tablet (600 mg total) by mouth every 6 (six) hours. 04/11/15   Standard, Venus, CNM  oxyCODONE-acetaminophen (PERCOCET/ROXICET) 5-325 MG per tablet Take 1 tablet by mouth every 4 (four) hours as needed (for pain scale 4-7). 04/11/15   Standard, Venus, CNM  Prenatal Vit-Fe Fumarate-FA (PRENATAL MULTIVITAMIN) TABS tablet Take 1 tablet by mouth daily.    [provider]    Family History Family History  Problem Relation Age of Onset  . Migraines Brother   . Spina bifida Cousin   . Cancer Father   . Stroke Maternal Grandfather     Social History Social History  Substance Use Topics  . Smoking status: Never Smoker  . Smokeless tobacco: Never Used  . Alcohol use No     Allergies   Penicillins and Azithromycin   Review of Systems Review of Systems  All other systems reviewed and are negative.    Physical Exam Updated Vital Signs BP 127/90 (BP Location: Right Arm)   Pulse (!) 113   Temp 98.1 F (36.7 C) (Oral)   Resp 16   SpO2 100%   Physical Exam  Constitutional: She is oriented to person, place, and time. She appears well-developed and well-nourished.  Non-toxic appearance. No distress.  HENT:  Head: Normocephalic  and atraumatic.  Eyes: Pupils are equal, round, and reactive to light. Conjunctivae, EOM and lids are normal.  Neck: Normal range of motion. Neck supple. No tracheal deviation present. No thyroid mass present.  Cardiovascular: Regular rhythm and normal heart sounds.  Tachycardia present.  Exam reveals no gallop.   No murmur heard. Pulmonary/Chest: Effort normal and breath sounds normal. No stridor. No respiratory distress. She has no decreased breath sounds. She has no wheezes. She has no rhonchi. She has no rales.  Abdominal: Soft. Normal appearance and bowel sounds are normal. She exhibits no distension. There is no tenderness. There is no rebound and no CVA tenderness.    Musculoskeletal: Normal range of motion. She exhibits no edema or tenderness.  Neurological: She is alert and oriented to person, place, and time. She has normal strength. No cranial nerve deficit or sensory deficit. GCS eye subscore is 4. GCS verbal subscore is 5. GCS motor subscore is 6.  Skin: Skin is warm and dry. No abrasion and no rash noted.  Psychiatric: She has a normal mood and affect. Her speech is normal and behavior is normal.  Nursing note and vitals reviewed.    ED Treatments / Results  Labs (all labs ordered are listed, but only abnormal results are displayed) Labs Reviewed  CBC WITH DIFFERENTIAL/PLATELET  COMPREHENSIVE METABOLIC PANEL  I-STAT BETA HCG BLOOD, ED ([facility], [facility], [facility] ONLY)    EKG  EKG Interpretation  Date/Time:  Saturday August 30 2017 12:08:20 EDT Ventricular Rate:  81 PR Interval:  102 QRS Duration: 80 QT Interval:  322 QTC Calculation: 374 R Axis:   72 Text Interpretation:  Undetermined rhythm Otherwise normal ECG Confirmed by Lorre Nick ([id number]) on 08/30/2017 1:31:17 PM       Radiology No results found.  Procedures Procedures (including critical care time)  Medications Ordered in ED Medications - No data to display   Initial Impression / Assessment and Plan / ED Course  I have reviewed the triage vital signs and the nursing notes.  Pertinent labs & imaging results that were available during my care of the patient were reviewed by me and considered in my medical decision making (see chart for details).     Patient's workup here without evidence of anemia or kidney disease. She is not orthostatic. Suspect some evidence of stress involving the symptoms and TSH is pending and she'll follow-up with this with her doctor.  Final Clinical Impressions(s) / ED Diagnoses   Final diagnoses:  SOB (shortness of breath)    New Prescriptions New Prescriptions   No medications on file     Lorre Nick, MD 08/30/17 1546

## 2017-08-30 NOTE — ED Triage Notes (Addendum)
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 In triage pt is hyperventilating with her hands cramping. Was able to reassure pt and get to control her breathing. Denies any pain or sob.

## 2017-08-30 NOTE — Discharge Instructions (Signed)
Call your doctor on Tuesday to schedule a follow-up visit and to get the results of your TSH level (thyroid function)

## 2017-09-03 ENCOUNTER — Encounter (HOSPITAL_COMMUNITY): Payer: Self-pay | Admitting: Emergency Medicine

## 2017-09-03 DIAGNOSIS — R42 Dizziness and giddiness: Secondary | ICD-10-CM | POA: Diagnosis not present

## 2017-09-03 DIAGNOSIS — R110 Nausea: Secondary | ICD-10-CM | POA: Diagnosis present

## 2017-09-03 DIAGNOSIS — F419 Anxiety disorder, unspecified: Secondary | ICD-10-CM | POA: Insufficient documentation

## 2017-09-03 LAB — COMPREHENSIVE METABOLIC PANEL
ALBUMIN: 4.6 g/dL (ref 3.5–5.0)
ALT: 18 U/L (ref 14–54)
AST: 30 U/L (ref 15–41)
Alkaline Phosphatase: 57 U/L (ref 38–126)
Anion gap: 11 (ref 5–15)
BILIRUBIN TOTAL: 1.2 mg/dL (ref 0.3–1.2)
BUN: 9 mg/dL (ref 6–20)
CALCIUM: 9.9 mg/dL (ref 8.9–10.3)
CO2: 20 mmol/L — ABNORMAL LOW (ref 22–32)
Chloride: 107 mmol/L (ref 101–111)
Creatinine, Ser: 0.64 mg/dL (ref 0.44–1.00)
GFR calc Af Amer: >60 mL/min (ref >60)
Glucose, Bld: 104 mg/dL — ABNORMAL HIGH (ref 65–99)
Potassium: 3.8 mmol/L (ref 3.5–5.1)
Sodium: 138 mmol/L (ref 135–145)
TOTAL PROTEIN: 7.8 g/dL (ref 6.5–8.1)

## 2017-09-03 LAB — URINALYSIS, ROUTINE W REFLEX MICROSCOPIC
Bilirubin Urine: NEGATIVE
GLUCOSE, UA: NEGATIVE mg/dL
Hgb urine dipstick: NEGATIVE
KETONES UR: 80 mg/dL — AB
Leukocytes, UA: NEGATIVE
NITRITE: NEGATIVE
PROTEIN: NEGATIVE mg/dL
Specific Gravity, Urine: 1.023 (ref 1.005–1.030)
pH: 5 (ref 5.0–8.0)

## 2017-09-03 LAB — CBC
HCT: 39.1 % (ref 36.0–46.0)
Hemoglobin: 13.2 g/dL (ref 12.0–15.0)
MCH: 29.4 pg (ref 26.0–34.0)
MCHC: 33.8 g/dL (ref 30.0–36.0)
MCV: 87.1 fL (ref 78.0–100.0)
Platelets: 237 10*3/uL (ref 150–400)
RBC: 4.49 MIL/uL (ref 3.87–5.11)
RDW: 12.6 % (ref 11.5–15.5)
WBC: 6.6 10*3/uL (ref 4.0–10.5)

## 2017-09-03 LAB — LIPASE, BLOOD: LIPASE: 28 U/L (ref 11–51)

## 2017-09-03 LAB — I-STAT BETA HCG BLOOD, ED (MC, WL, AP ONLY): I-stat hCG, quantitative: <5 m[IU]/mL (ref <5)

## 2017-09-03 MED ORDER — ONDANSETRON 4 MG PO TBDP
4.0000 mg | ORAL_TABLET | Freq: Once | ORAL | Status: AC | PRN
  Administered 2017-09-03: 4 mg via ORAL

## 2017-09-03 MED ORDER — ONDANSETRON 4 MG PO TBDP
ORAL_TABLET | ORAL | Status: AC
  Filled 2017-09-03: qty 1

## 2017-09-03 NOTE — ED Triage Notes (Signed)
Pt states she woke up this morning with a panic attack was seen by PCP and sent home with prescriptions, pt now feeling heartburn, nausea and weakness all over. No fever or chills

## 2017-09-04 ENCOUNTER — Emergency Department (HOSPITAL_COMMUNITY)
Admission: EM | Admit: 2017-09-04 | Discharge: 2017-09-04 | Disposition: A | Discharge status: Home / Self Care | Payer: BC Managed Care – PPO | Attending: Emergency Medicine | Admitting: Emergency Medicine

## 2017-09-04 DIAGNOSIS — R42 Dizziness and giddiness: Secondary | ICD-10-CM

## 2017-09-04 DIAGNOSIS — F419 Anxiety disorder, unspecified: Secondary | ICD-10-CM

## 2017-09-04 DIAGNOSIS — R110 Nausea: Secondary | ICD-10-CM

## 2017-09-04 LAB — I-STAT TROPONIN, ED: Troponin i, poc: 0 ng/mL (ref 0.00–0.08)

## 2017-09-04 MED ORDER — GI COCKTAIL ~~LOC~~
30.0000 mL | Freq: Once | ORAL | Status: AC
  Administered 2017-09-04: 30 mL via ORAL
  Filled 2017-09-04: qty 30

## 2017-09-04 MED ORDER — ONDANSETRON HCL 4 MG/2ML IJ SOLN
4.0000 mg | Freq: Once | INTRAMUSCULAR | Status: AC
  Administered 2017-09-04: 4 mg via INTRAVENOUS
  Filled 2017-09-04: qty 2

## 2017-09-04 MED ORDER — HYDROXYZINE HCL 25 MG PO TABS
25.0000 mg | ORAL_TABLET | Freq: Once | ORAL | Status: AC
  Administered 2017-09-04: 25 mg via ORAL
  Filled 2017-09-04: qty 1

## 2017-09-04 MED ORDER — HALOPERIDOL LACTATE 5 MG/ML IJ SOLN
3.0000 mg | Freq: Once | INTRAMUSCULAR | Status: AC
  Administered 2017-09-04: 3 mg via INTRAVENOUS
  Filled 2017-09-04: qty 1

## 2017-09-04 MED ORDER — SODIUM CHLORIDE 0.9 % IV BOLUS (SEPSIS)
1000.0000 mL | Freq: Once | INTRAVENOUS | Status: AC
  Administered 2017-09-04: 1000 mL via INTRAVENOUS

## 2017-09-04 NOTE — ED Provider Notes (Signed)
[facility] Provider Note   CSN: [id number] Arrival date & time: 09/03/17  1833     History   Chief Complaint Chief Complaint  Patient presents with  . Abdominal Pain  . Nausea    HPI Cynthia Henderson is a 36 y.o. female.  HPI   Woke up at 4AM, felt nausea, dizzy, lightheadedness hot, felt out of control Was here Saturday with first panic attack Woke husband up because feeling nausea, body trembling, feels like hands are sleeping bilaterally, feels short of breath, then it slows down, then it starts again in same cycle.  Before the initial panic attack, felt palpitations, stopped drinking coffee, not feeling like heart racing or skipping a beat anymore but feels like it is stronger at times Heart burn going from epigastrium up through chest, worse when laying down  Saw PCP, was given clonazepam .  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 Anemia    post D @ [facility]  . Headache(784.0)    migraine  . Prolonged rupture of membranes 07/20/2012    Patient Active Problem List   Diagnosis Date Noted  . NSVD (normal spontaneous vaginal delivery) 04/10/2015  . Incomplete spontaneous abortion - D&C 04/29/14 04/09/2015  . Hemorrhoids during pregnancy 04/09/2015  . History of anemia 04/09/2015  . Seasonal allergies 04/09/2015  . Penicillin allergy 04/15/2012    Past Surgical History:  Procedure Laterality Date  . DILATION AND CURETTAGE OF UTERUS  2012  . DILATION AND CURETTAGE OF UTERUS    . DILATION AND EVACUATION N/A 04/15/2014   Procedure: DILATATION AND EVACUATION;  Surgeon: Purcell Nails, MD;  Location: [facility];  Service: Gynecology;  Laterality: N/A;  . WISDOM TOOTH EXTRACTION  2007    OB History    Gravida Para Term Preterm AB Living   SAB TAB Ectopic Multiple Live Births   2     0 2       Home Medications    Prior to Admission medications   Medication Sig Start Date End Date Taking? Authorizing Provider  clonazePAM (KLONOPIN) 0.5 MG tablet Take 0.5 mg by mouth daily as needed for anxiety. 09/03/17  Yes [provider]  sertraline (ZOLOFT) 50 MG tablet Take 50 mg by mouth daily. 09/03/17  Yes [provider]    Family History Family History  Problem Relation Age of Onset  . Migraines Brother   . Spina bifida Cousin   . Cancer Father   . Stroke Maternal Grandfather     Social History Social History  Substance Use Topics  . Smoking status: Never Smoker  . Smokeless tobacco: Never Used  . Alcohol use No     Allergies   Penicillins and Azithromycin   Review of Systems Review of Systems  Constitutional: Positive for appetite change and fatigue. Negative for fever.  HENT: Negative for sore throat.   Eyes: Negative for visual disturbance.  Respiratory: Negative for cough and shortness of breath.  Cardiovascular: Positive for chest pain. Negative for palpitations.  Gastrointestinal: Positive for abdominal pain and nausea. Negative for diarrhea and vomiting.  Genitourinary: Negative for difficulty urinating and dysuria.  Musculoskeletal: Negative for back pain and neck pain.  Skin: Negative for rash.  Neurological: Positive for light-headedness. Negative for syncope and headaches.  Psychiatric/Behavioral: The patient is nervous/anxious.      Physical Exam Updated Vital Signs BP 113/76   Pulse 86   Temp 98.3 F (36.8 C) (Oral)   Resp 17   Ht 5\' 4"  (1.626 m)   Wt 44.5 kg (98 lb)   LMP 08/21/2017 (Exact Date)   SpO2 99%   BMI 16.82 kg/m    Physical Exam  Constitutional: She is oriented to person, place, and time. She appears well-developed and well-nourished. No distress.  HENT:  Head: Normocephalic and atraumatic.  Eyes: Conjunctivae and EOM are normal.  Neck: Normal range of motion.  Cardiovascular: Normal rate, regular rhythm, normal heart sounds and intact distal pulses.  Exam reveals no gallop and no friction rub.   No murmur heard. Pulmonary/Chest: Effort normal and breath sounds normal. No respiratory distress. She has no wheezes. She has no rales.  Abdominal: Soft. She exhibits no distension. There is no tenderness. There is no guarding.  Musculoskeletal: She exhibits no edema or tenderness.  Neurological: She is alert and oriented to person, place, and time.  Skin: Skin is warm and dry. No rash noted. She is not diaphoretic. No erythema.  Psychiatric: Her mood appears anxious.  Nursing note and vitals reviewed.    ED Treatments / Results  Labs (all labs ordered are listed, but only abnormal results are displayed) Labs Reviewed  COMPREHENSIVE METABOLIC PANEL - Abnormal; Notable for the following:       Result Value   CO2 20 (*)    Glucose, Bld 104 (*)    All other components within normal limits  URINALYSIS, ROUTINE W REFLEX MICROSCOPIC - Abnormal; Notable for the following:    APPearance HAZY (*)    Ketones, ur 80 (*)    All other components within normal limits  LIPASE, BLOOD  CBC  I-STAT BETA HCG BLOOD, ED ([facility], [facility], [facility] ONLY)  I-STAT TROPONIN, ED    EKG  EKG Interpretation  Date/Time:  Wednesday September 03 2017 18:59:27 EDT Ventricular Rate:  104 PR Interval:  150 QRS Duration: 74 QT Interval:  334 QTC Calculation: 439 R Axis:   77 Text Interpretation:  Sinus tachycardia Nonspecific ST abnormality Abnormal ECG No significant change since last tracing Confirmed by Alvira MondaySchlossman, Waller Marcussen ([id number]) on 09/04/2017 2:35:54 AM       Radiology No results found.  Procedures Procedures (including  critical care time)  Medications Ordered in ED Medications  ondansetron (ZOFRAN-ODT) disintegrating tablet 4 mg (4 mg Oral Given 09/03/17 1913)  sodium chloride 0.9 % bolus 1,000 mL (0 mLs Intravenous Stopped 09/04/17 0547)  ondansetron (ZOFRAN) injection 4 mg (4 mg Intravenous Given 09/04/17 0332)  hydrOXYzine (ATARAX/VISTARIL) tablet 25 mg (25 mg Oral Given 09/04/17 0332)  gi cocktail (Maalox,Lidocaine,Donnatal) (30 mLs Oral Given 09/04/17 0332)  haloperidol lactate (HALDOL) injection 3 mg (3 mg Intravenous Given 09/04/17 0511)     Initial Impression / Assessment and Plan / ED Course  I have reviewed the triage vital signs and the nursing notes.  Pertinent labs & imaging results that were available during my care of the patient were reviewed by me and considered in my medical decision making (see chart for details).  36 year old female presents with concern for anxiety, nausea, lightheadedness, feeling of "being out of control."  She has no prior history of anxiety, but was seen in the emergency department on Saturday for similar. She had a TSH was done and within normal limits.  She is not having large spikes in blood pressures were heart rate to suggest pheochromocytoma. There is no sign of anemia. No leukocytosis. No transaminitis, and normal electrolytes. Reports some epigastric pain, abdominal exam is benign, and low suspicion for acute cholecystitis. Lipase is within normal limits. Pregnancy test is negative. Urinalysis shows no sign of infection. I does show ketonuria, likely secondary to patient's decreased by mouth intake. Patient reports burning in the chest, and troponin is negative. No sign of pericarditis or ACS. Doubt pulmonary embolus given normal oxygenation, episodic symptoms, no PE risk factors.  Pt denies withdrawal rom etoh or benzos. Symptoms do not sound consistent with vertigo. Do feel anxiety likely cause of symptoms although recommend continued outpatient follow up.  Symptoms  likely exacerbated by dehydration, poor po intake.  Given IV fluids, GI cocktail, zofran.   Patient reports severe nausea continuing, is shaking in room and tachycardic with sinus tachycardia on the monitor. No dyspnea, doubt PE. Given haldol with improvement in symptoms, however reports some continuing anxiety.  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  Recommended breathing and stress reducing techniques, following up with counselor and PCP. Patient discharged in stable condition with understanding of reasons to return.   Final Clinical Impressions(s) / ED Diagnoses   Final diagnoses:  Nausea  Lightheadedness  Anxiety    New Prescriptions Discharge Medication List as of 09/04/2017  6:55 AM       Alvira Monday, MD 09/04/17 1531

## 2017-10-16 ENCOUNTER — Other Ambulatory Visit (HOSPITAL_COMMUNITY): Payer: Self-pay

## 2017-10-16 ENCOUNTER — Emergency Department (HOSPITAL_COMMUNITY): Payer: BC Managed Care – PPO

## 2017-10-16 ENCOUNTER — Encounter (HOSPITAL_COMMUNITY): Payer: Self-pay | Admitting: Emergency Medicine

## 2017-10-16 ENCOUNTER — Emergency Department (HOSPITAL_COMMUNITY)
Admission: EM | Admit: 2017-10-16 | Discharge: 2017-10-16 | Disposition: A | Discharge status: Home / Self Care | Payer: BC Managed Care – PPO | Attending: Emergency Medicine | Admitting: Emergency Medicine

## 2017-10-16 DIAGNOSIS — R1013 Epigastric pain: Secondary | ICD-10-CM | POA: Diagnosis not present

## 2017-10-16 DIAGNOSIS — R079 Chest pain, unspecified: Secondary | ICD-10-CM | POA: Diagnosis present

## 2017-10-16 LAB — CBC WITH DIFFERENTIAL/PLATELET
BASOS ABS: 0 10*3/uL (ref 0.0–0.1)
Basophils Relative: 0 %
EOS ABS: 0 10*3/uL (ref 0.0–0.7)
EOS PCT: 0 %
HCT: 38 % (ref 36.0–46.0)
Hemoglobin: 12.7 g/dL (ref 12.0–15.0)
LYMPHS PCT: 6 %
Lymphs Abs: 0.8 10*3/uL (ref 0.7–4.0)
MCH: 29.4 pg (ref 26.0–34.0)
MCHC: 33.4 g/dL (ref 30.0–36.0)
MCV: 88 fL (ref 78.0–100.0)
Monocytes Absolute: 0.5 10*3/uL (ref 0.1–1.0)
Monocytes Relative: 4 %
NEUTROS ABS: 12.1 10*3/uL — AB (ref 1.7–7.7)
NEUTROS PCT: 90 %
PLATELETS: 264 10*3/uL (ref 150–400)
RBC: 4.32 MIL/uL (ref 3.87–5.11)
RDW: 12.9 % (ref 11.5–15.5)
WBC: 13.5 10*3/uL — ABNORMAL HIGH (ref 4.0–10.5)

## 2017-10-16 LAB — COMPREHENSIVE METABOLIC PANEL
ALBUMIN: 4.7 g/dL (ref 3.5–5.0)
ALT: 21 U/L (ref 14–54)
AST: 30 U/L (ref 15–41)
Alkaline Phosphatase: 75 U/L (ref 38–126)
Anion gap: 12 (ref 5–15)
BUN: 13 mg/dL (ref 6–20)
CHLORIDE: 105 mmol/L (ref 101–111)
CO2: 20 mmol/L — ABNORMAL LOW (ref 22–32)
Calcium: 10.1 mg/dL (ref 8.9–10.3)
Creatinine, Ser: 0.8 mg/dL (ref 0.44–1.00)
GFR calc Af Amer: >60 mL/min (ref >60)
GFR calc non Af Amer: >60 mL/min (ref >60)
Glucose, Bld: 142 mg/dL — ABNORMAL HIGH (ref 65–99)
POTASSIUM: 3.3 mmol/L — AB (ref 3.5–5.1)
SODIUM: 137 mmol/L (ref 135–145)
Total Bilirubin: 0.8 mg/dL (ref 0.3–1.2)
Total Protein: 7.6 g/dL (ref 6.5–8.1)

## 2017-10-16 LAB — LIPASE, BLOOD: LIPASE: 28 U/L (ref 11–51)

## 2017-10-16 LAB — I-STAT TROPONIN, ED: TROPONIN I, POC: 0 ng/mL (ref 0.00–0.08)

## 2017-10-16 MED ORDER — ONDANSETRON 8 MG PO TBDP: 8.0000 mg | ORAL_TABLET | Freq: Three times a day (TID) | ORAL | 0 refills | Status: AC | PRN

## 2017-10-16 NOTE — ED Notes (Signed)
Pt wheeled back to room and placed on monitor. Pt had no complaints.

## 2017-10-16 NOTE — ED Provider Notes (Signed)
[facility] EMERGENCY DEPARTMENT Provider Note   CSN: [id number] Arrival date & time: 10/16/17  4098     History   Chief Complaint Chief Complaint  Patient presents with  . Anxiety  . Chest Pain    HPI Cynthia Henderson is a 36 y.o. female.  HPI  36 year old female history of anxiety presents today with lower chest pain with nausea and vomiting. She states that the nausea began in the early a.m. She vomited bile type material. She then began having some pain in her lower chest. She describes this as similar to pain with previous anxiety. She states that she was wearing a monitor until yesterday. She was seen at [facility] today and sent here after being told that her EKG was abnormal. I have reviewed her EKG from the office. EKG from office is technically poor with interference, most beats appear to be sinus but some interpolated PACs appear to occur although cannot rule out some A. fib or flutter intermittently area  Past Medical History:  Diagnosis Date  . Anemia    post D @ E  . Headache(784.0)    migraine  . Prolonged rupture of membranes 07/20/2012    Patient Active Problem List   Diagnosis Date Noted  . NSVD (normal spontaneous vaginal delivery) 04/10/2015  . Incomplete spontaneous abortion - D&C 04/29/14 04/09/2015  . Hemorrhoids during pregnancy 04/09/2015  . History of anemia 04/09/2015  . Seasonal allergies 04/09/2015  . Penicillin allergy 04/15/2012    Past Surgical History:  Procedure Laterality Date  . DILATION AND CURETTAGE OF UTERUS  2012  . DILATION AND CURETTAGE OF UTERUS    . DILATION AND EVACUATION N/A 04/15/2014   Procedure: DILATATION AND EVACUATION;  Surgeon: Purcell Nails, MD;  Location: [facility];  Service: Gynecology;  Laterality: N/A;  . WISDOM TOOTH EXTRACTION  2007    OB History    Gravida Para Term Preterm AB Living   5 2 2   2 2    SAB TAB Ectopic Multiple Live Births   2     0 2       Home Medications    Prior to  Admission medications   Medication Sig Start Date End Date Taking? Authorizing Provider  clonazePAM (KLONOPIN) 0.5 MG tablet Take 0.5 mg by mouth daily as needed for anxiety. 09/03/17  Yes [provider]  sertraline (ZOLOFT) 50 MG tablet Take 50 mg by mouth daily. 09/03/17  Yes [provider]    Family History Family History  Problem Relation Age of Onset  . Migraines Brother   . Spina bifida Cousin   . Cancer Father   . Stroke Maternal Grandfather     Social History Social History  Substance Use Topics  . Smoking status: Never Smoker  . Smokeless tobacco: Never Used  . Alcohol use No     Allergies   Penicillins and Azithromycin   Review of Systems Review of Systems  All other systems reviewed and are negative.    Physical Exam Updated Vital Signs BP 120/66 (BP Location: Right Arm)   Pulse 93   Temp 98.1 F (36.7 C) (Oral)   Resp 16   Ht 1.626 m (5\' 4" )   Wt 47.2 kg (104 lb)   LMP 10/09/2017 (Exact Date)   SpO2 100%   BMI 17.85 kg/m   Physical Exam  Constitutional: She is oriented to person, place, and time. She appears well-developed and well-nourished. She appears distressed.  HENT:  Head: Normocephalic  and atraumatic.  Right Ear: External ear normal.  Left Ear: External ear normal.  Mouth/Throat: Oropharynx is clear and moist.  Eyes: Pupils are equal, round, and reactive to light. Conjunctivae and EOM are normal.  Neck: Normal range of motion. Neck supple.  Cardiovascular: Normal rate, regular rhythm, normal heart sounds and intact distal pulses.   Pulmonary/Chest: Effort normal and breath sounds normal.  Abdominal: Soft. Bowel sounds are normal. There is tenderness.  Epigastric tenderness to palpation  Musculoskeletal: Normal range of motion.  Neurological: She is alert and oriented to person, place, and time.  Skin: Skin is warm. Capillary refill takes less than 2 seconds.  Psychiatric: She has a normal mood and affect.  Nursing  note and vitals reviewed.    ED Treatments / Results  Labs (all labs ordered are listed, but only abnormal results are displayed) Labs Reviewed  CBC WITH DIFFERENTIAL/PLATELET  LIPASE, BLOOD  COMPREHENSIVE METABOLIC PANEL  I-STAT TROPONIN, ED    EKG  EKG Interpretation  Date/Time:  Thursday October 16 2017 09:41:29 EDT Ventricular Rate:  96 PR Interval:  170 QRS Duration: 82 QT Interval:  356 QTC Calculation: 449 R Axis:   80 Text Interpretation:  Normal sinus rhythm Normal ECG Confirmed by Margarita Grizzleay, Offie Pickron [phone]([id number]) on 10/16/2017 10:12:42 AM       Radiology No results found.  Procedures Procedures (including critical care time)  Medications Ordered in ED Medications - No data to display   Initial Impression / Assessment and Plan / ED Course  I have reviewed the triage vital signs and the nursing notes.  Pertinent labs & imaging results that were available during my care of the patient were reviewed by me and considered in my medical decision making (see chart for details).    1- nausea vomiting epigastric pain 2 ekg here is nsr- it is possible that she had some arrhythmia on ekg from office but appears supraventricular and is likely pacs troponin   Some leukocytosis, mild hyperglycemia, mild hypokalemia.  Patient hemodynamically stable. Discussed return precautions and need for follow up.   Final Clinical Impressions(s) / ED Diagnoses   Final diagnoses:  Epigastric pain    New Prescriptions New Prescriptions   No medications on file     Margarita Grizzleay, Shiraz Bastyr, MD 10/16/17 1325

## 2017-10-16 NOTE — Discharge Instructions (Signed)
You have a slightly elevated white blood cell count. Your blood sugar is elevated at 142 EKG is normal  These changes may be from stress, but could also be from many other causes.  Please recheck with your doctor and return here if worse.

## 2017-10-16 NOTE — ED Notes (Signed)
Pt returned to room from US.

## 2017-10-16 NOTE — ED Triage Notes (Signed)
Pt states, "I felt like I was having a panic attack, then my chest started hurting all across. " Pt reports n/v, states unable to take anxiety meds d/t nausea.  Pt hyperventilating on arrival, given brown bag.  Resp e/u at this time.

## 2018-06-04 IMAGING — US US ABDOMEN COMPLETE
1 series · 14 of 25 positions shown · non-contrast
Comparison: Ultrasound 04/09/2015.

CLINICAL DATA: Abdominal pain.

EXAM:
ABDOMEN ULTRASOUND COMPLETE

[Series 1: us abdomen complete · 0.20mm/px · 14 of 64 slices shown]
[im 1/64]
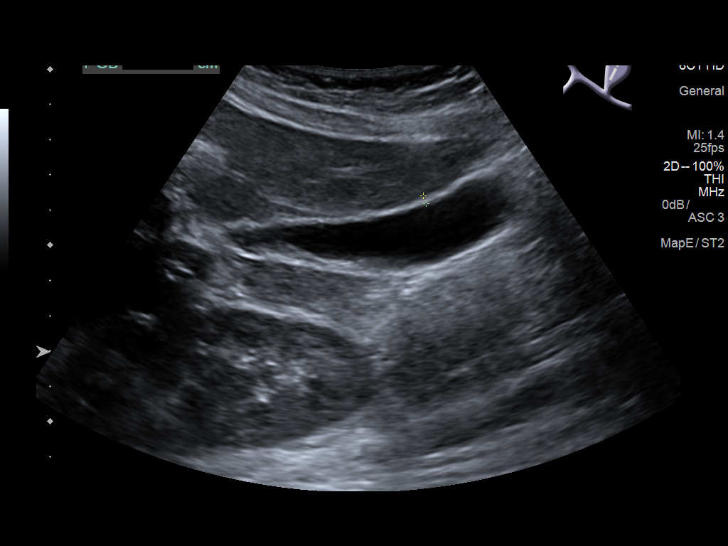
[im 6/64]
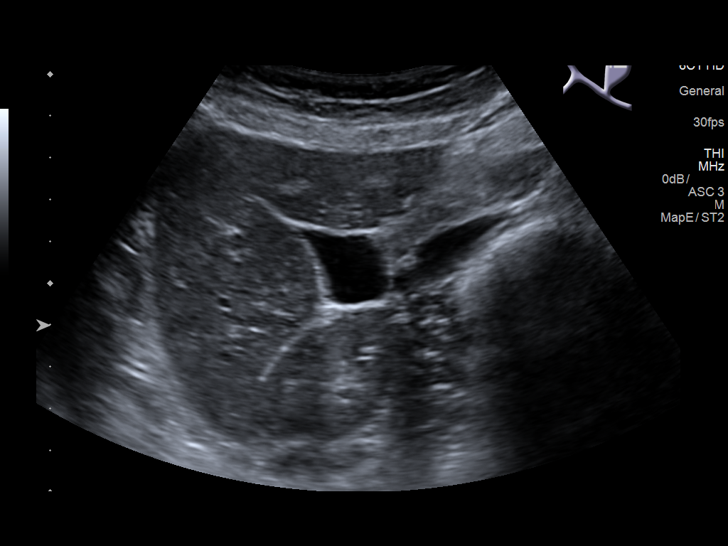
[im 11/64]
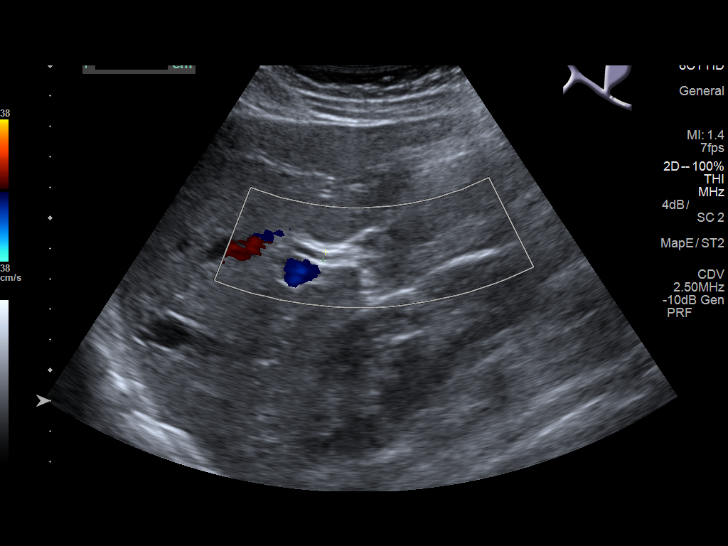
[im 16/64]
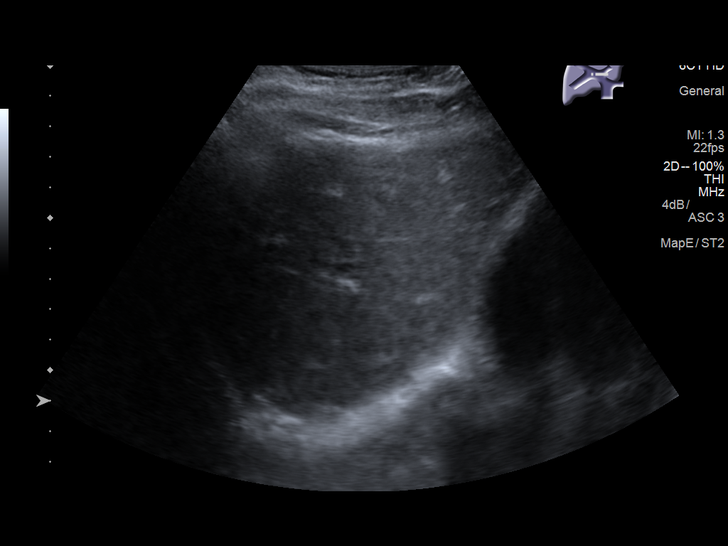
[im 22/64]
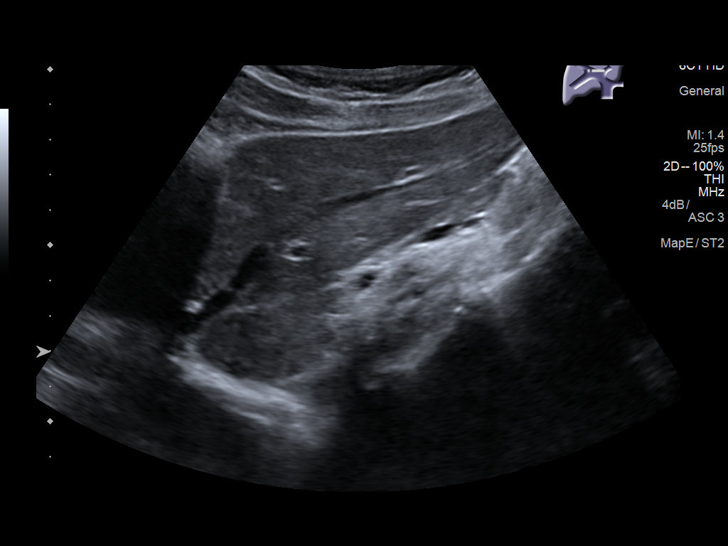
[im 24/64]
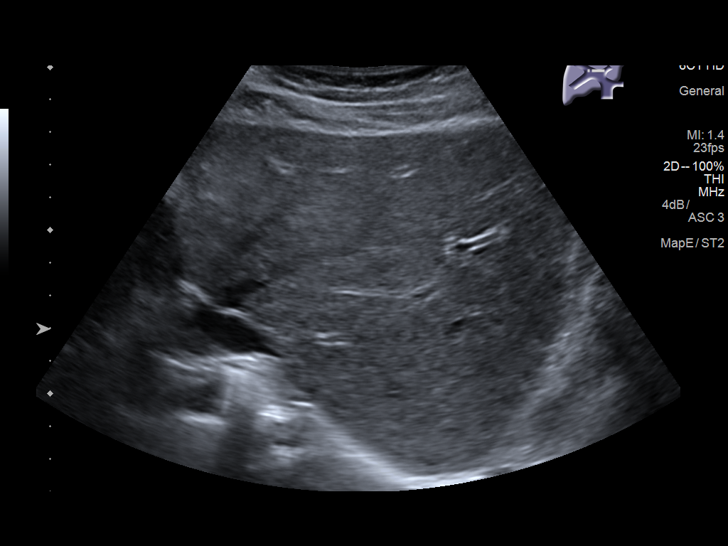
[im 29/64]
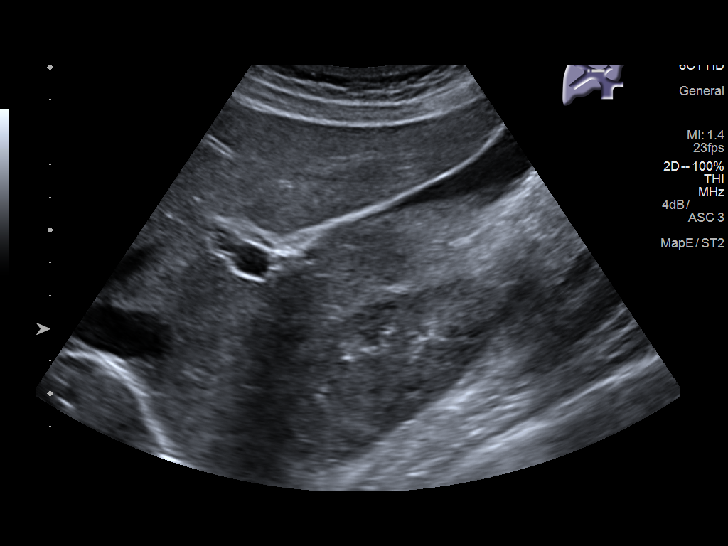
[im 35/64]
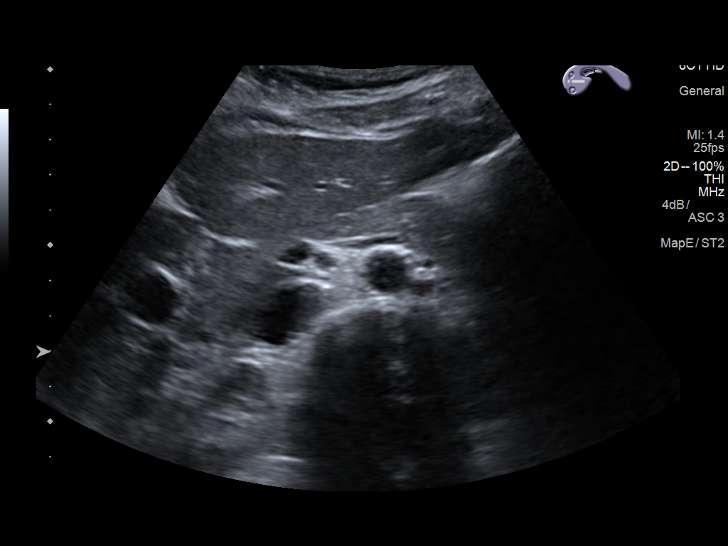
[im 40/64]
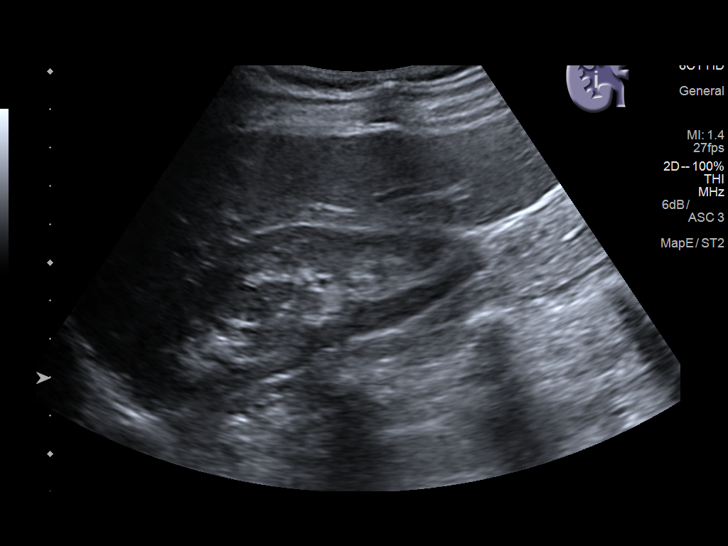
[im 43/64]
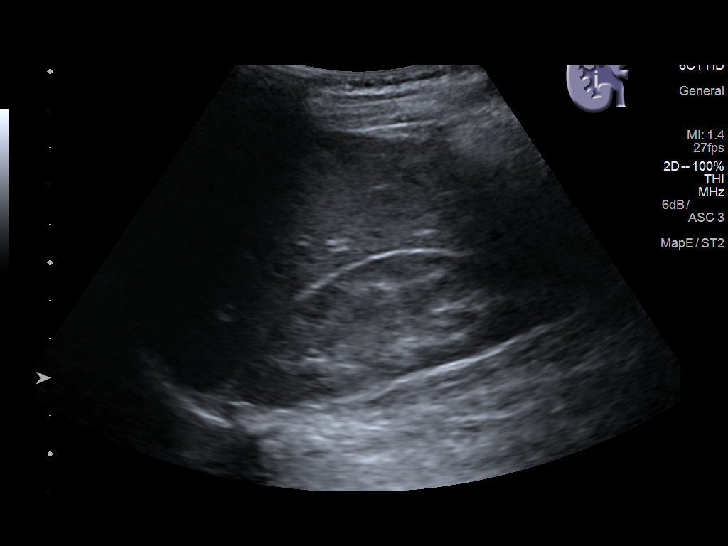
[im 48/64]
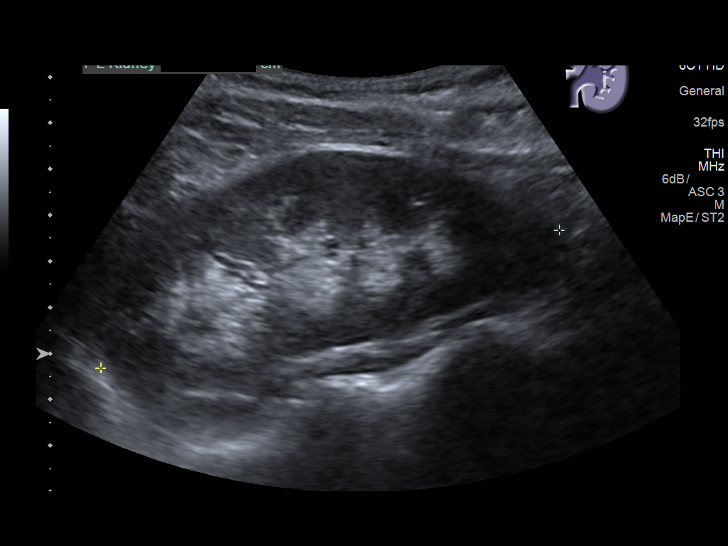
[im 53/64]
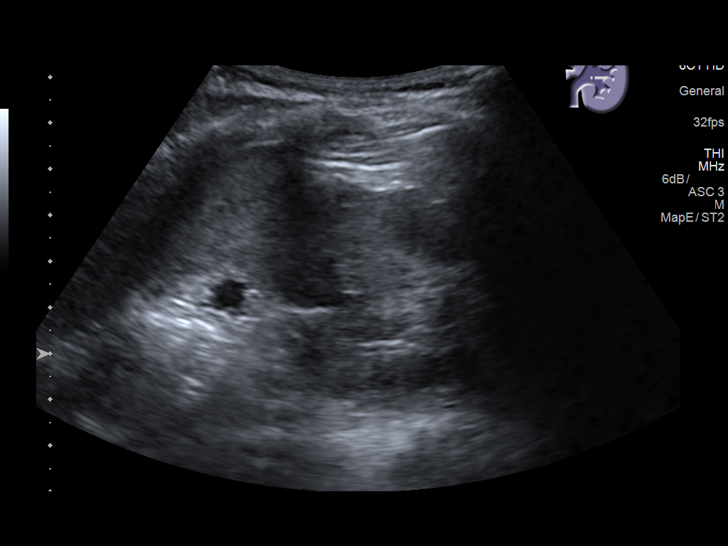
[im 58/64]
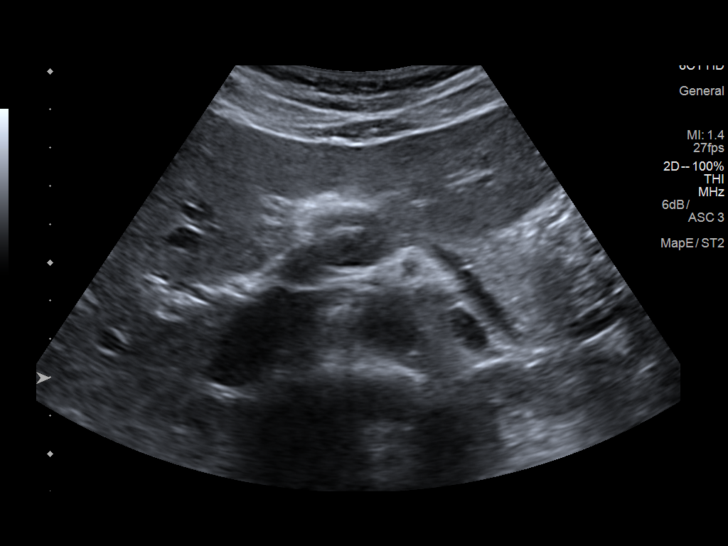
[im 64/64]
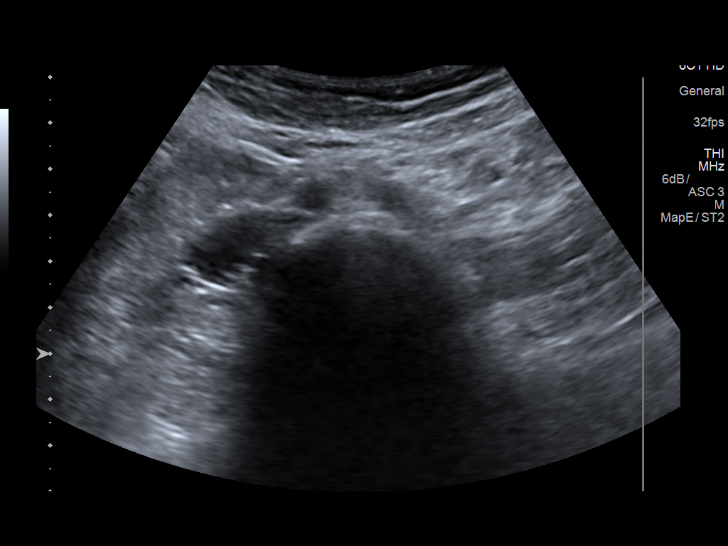

[14 of 25 positions shown; findings below may reference images not displayed]

FINDINGS: Gallbladder: No gallstones or wall thickening visualized. No
sonographic Murphy sign noted by sonographer.

Common bile duct: Diameter: 2.9 mm

Liver: No focal lesion identified. Within normal limits in
parenchymal echogenicity. Portal vein is patent on color Doppler
imaging with normal direction of blood flow towards the liver.

IVC: No abnormality visualized.

Pancreas: Visualized portion unremarkable.

Spleen: Size and appearance within normal limits.

Right Kidney: Length: 10.3 cm. Echogenicity within normal limits. No
mass or hydronephrosis visualized.

Left Kidney: Length: 10.4 cm. Echogenicity within normal limits. No
mass or hydronephrosis visualized.

Abdominal aorta: No aneurysm visualized.

Other findings: None.
IMPRESSION: No acute or focal abnormality identified.

## 2020-03-04 ENCOUNTER — Ambulatory Visit: Payer: BC Managed Care – PPO | Attending: Internal Medicine

## 2020-03-04 DIAGNOSIS — Z23 Encounter for immunization: Secondary | ICD-10-CM | POA: Insufficient documentation

## 2020-03-04 NOTE — Progress Notes (Signed)
   Covid-19 Vaccination Clinic  Name:  Cynthia Henderson    MRN: [id number] DOB: 19-Nov-1981  03/04/2020  [name] was observed post Covid-19 immunization for 15 minutes without incident. She was provided with Vaccine Information Sheet and instruction to access the V-Safe system.   [name] was instructed to call 911 with any severe reactions post vaccine: Marland Kitchen Difficulty breathing  . Swelling of face and throat  . A fast heartbeat  . A bad rash all over body  . Dizziness and weakness   Immunizations Administered    Name Date Dose VIS Date Route   Pfizer COVID-19 Vaccine 03/04/2020  9:11 AM 0.3 mL 12/10/2019 Intramuscular   Manufacturer: [employer], [employer]   Lot: [id number]   NDC: [address]-9

## 2020-03-25 ENCOUNTER — Ambulatory Visit: Payer: BC Managed Care – PPO | Attending: Internal Medicine

## 2020-03-25 DIAGNOSIS — Z23 Encounter for immunization: Secondary | ICD-10-CM

## 2020-03-25 NOTE — Progress Notes (Signed)
   Covid-19 Vaccination Clinic  Name:  Cynthia Henderson    MRN: [id number] DOB: May 10, 1981  03/25/2020  Cynthia Henderson was observed post Covid-19 immunization for 15 minutes without incident. She was provided with Vaccine Information Sheet and instruction to access the V-Safe system.   Cynthia Henderson was instructed to call 911 with any severe reactions post vaccine: Marland Kitchen Difficulty breathing  . Swelling of face and throat  . A fast heartbeat  . A bad rash all over body  . Dizziness and weakness   Immunizations Administered    Name Date Dose VIS Date Route   Pfizer COVID-19 Vaccine 03/25/2020  8:30 AM 0.3 mL 12/10/2019 Intramuscular   Manufacturer: [employer], [employer]   Lot: [id number]   NDC: [address]-4
